# Patient Record
Sex: Female | Born: 1960 | Race: White | Hispanic: No | Marital: Married | Smoking: Former smoker
Health system: Southern US, Community
[De-identification: ages and names within clinical notes are randomized; demographics above are authoritative.]

## PROBLEM LIST (undated history)

## (undated) DIAGNOSIS — J45909 Unspecified asthma, uncomplicated: Secondary | ICD-10-CM

## (undated) DIAGNOSIS — E119 Type 2 diabetes mellitus without complications: Secondary | ICD-10-CM

## (undated) DIAGNOSIS — M545 Low back pain, unspecified: Secondary | ICD-10-CM

## (undated) DIAGNOSIS — J42 Unspecified chronic bronchitis: Secondary | ICD-10-CM

## (undated) DIAGNOSIS — K219 Gastro-esophageal reflux disease without esophagitis: Secondary | ICD-10-CM

## (undated) DIAGNOSIS — I219 Acute myocardial infarction, unspecified: Secondary | ICD-10-CM

## (undated) DIAGNOSIS — M1990 Unspecified osteoarthritis, unspecified site: Secondary | ICD-10-CM

## (undated) DIAGNOSIS — F329 Major depressive disorder, single episode, unspecified: Secondary | ICD-10-CM

## (undated) DIAGNOSIS — F32A Depression, unspecified: Secondary | ICD-10-CM

## (undated) DIAGNOSIS — F319 Bipolar disorder, unspecified: Secondary | ICD-10-CM

## (undated) DIAGNOSIS — G629 Polyneuropathy, unspecified: Secondary | ICD-10-CM

## (undated) DIAGNOSIS — K279 Peptic ulcer, site unspecified, unspecified as acute or chronic, without hemorrhage or perforation: Secondary | ICD-10-CM

## (undated) DIAGNOSIS — I10 Essential (primary) hypertension: Secondary | ICD-10-CM

## (undated) DIAGNOSIS — E785 Hyperlipidemia, unspecified: Secondary | ICD-10-CM

## (undated) DIAGNOSIS — I639 Cerebral infarction, unspecified: Secondary | ICD-10-CM

## (undated) DIAGNOSIS — G8929 Other chronic pain: Secondary | ICD-10-CM

## (undated) DIAGNOSIS — R569 Unspecified convulsions: Secondary | ICD-10-CM

## (undated) DIAGNOSIS — G43909 Migraine, unspecified, not intractable, without status migrainosus: Secondary | ICD-10-CM

## (undated) DIAGNOSIS — F419 Anxiety disorder, unspecified: Secondary | ICD-10-CM

## (undated) DIAGNOSIS — A048 Other specified bacterial intestinal infections: Secondary | ICD-10-CM

## (undated) HISTORY — PX: CHOLECYSTECTOMY OPEN: SUR202

## (undated) HISTORY — PX: APPENDECTOMY: SHX54

## (undated) HISTORY — PX: TUBAL LIGATION: SHX77

---

## 1998-06-08 ENCOUNTER — Emergency Department (HOSPITAL_COMMUNITY): Admission: EM | Admit: 1998-06-08 | Discharge: 1998-06-08 | Payer: Self-pay | Admitting: Emergency Medicine

## 2001-07-31 ENCOUNTER — Emergency Department (HOSPITAL_COMMUNITY): Admission: EM | Admit: 2001-07-31 | Discharge: 2001-07-31 | Payer: Self-pay | Admitting: Emergency Medicine

## 2001-08-19 ENCOUNTER — Emergency Department (HOSPITAL_COMMUNITY): Admission: EM | Admit: 2001-08-19 | Discharge: 2001-08-19 | Payer: Self-pay | Admitting: Emergency Medicine

## 2001-08-19 ENCOUNTER — Encounter: Payer: Self-pay | Admitting: Emergency Medicine

## 2001-11-21 ENCOUNTER — Emergency Department (HOSPITAL_COMMUNITY): Admission: EM | Admit: 2001-11-21 | Discharge: 2001-11-22 | Payer: Self-pay | Admitting: Emergency Medicine

## 2001-11-22 ENCOUNTER — Encounter: Payer: Self-pay | Admitting: Family Medicine

## 2001-11-22 ENCOUNTER — Encounter: Payer: Self-pay | Admitting: Emergency Medicine

## 2002-02-01 ENCOUNTER — Emergency Department (HOSPITAL_COMMUNITY): Admission: EM | Admit: 2002-02-01 | Discharge: 2002-02-01 | Payer: Self-pay | Admitting: Emergency Medicine

## 2002-02-01 ENCOUNTER — Encounter: Payer: Self-pay | Admitting: Emergency Medicine

## 2002-02-13 ENCOUNTER — Emergency Department (HOSPITAL_COMMUNITY): Admission: EM | Admit: 2002-02-13 | Discharge: 2002-02-13 | Payer: Self-pay | Admitting: Emergency Medicine

## 2002-03-16 ENCOUNTER — Emergency Department (HOSPITAL_COMMUNITY): Admission: EM | Admit: 2002-03-16 | Discharge: 2002-03-16 | Payer: Self-pay | Admitting: Emergency Medicine

## 2002-03-16 ENCOUNTER — Encounter: Payer: Self-pay | Admitting: Emergency Medicine

## 2002-05-02 ENCOUNTER — Encounter: Payer: Self-pay | Admitting: Emergency Medicine

## 2002-05-02 ENCOUNTER — Emergency Department (HOSPITAL_COMMUNITY): Admission: EM | Admit: 2002-05-02 | Discharge: 2002-05-02 | Payer: Self-pay | Admitting: Emergency Medicine

## 2002-05-30 ENCOUNTER — Emergency Department (HOSPITAL_COMMUNITY): Admission: AD | Admit: 2002-05-30 | Discharge: 2002-05-31 | Payer: Self-pay

## 2002-05-31 ENCOUNTER — Encounter: Payer: Self-pay | Admitting: Emergency Medicine

## 2002-08-01 ENCOUNTER — Encounter: Payer: Self-pay | Admitting: Emergency Medicine

## 2002-08-01 ENCOUNTER — Emergency Department (HOSPITAL_COMMUNITY): Admission: EM | Admit: 2002-08-01 | Discharge: 2002-08-02 | Payer: Self-pay | Admitting: *Deleted

## 2003-02-11 ENCOUNTER — Emergency Department (HOSPITAL_COMMUNITY): Admission: EM | Admit: 2003-02-11 | Discharge: 2003-02-11 | Payer: Self-pay | Admitting: Emergency Medicine

## 2003-03-05 ENCOUNTER — Emergency Department (HOSPITAL_COMMUNITY): Admission: EM | Admit: 2003-03-05 | Discharge: 2003-03-05 | Payer: Self-pay | Admitting: Emergency Medicine

## 2003-03-09 ENCOUNTER — Ambulatory Visit (HOSPITAL_COMMUNITY): Admission: RE | Admit: 2003-03-09 | Discharge: 2003-03-09 | Payer: Self-pay | Admitting: Orthopedic Surgery

## 2003-07-04 ENCOUNTER — Emergency Department (HOSPITAL_COMMUNITY): Admission: EM | Admit: 2003-07-04 | Discharge: 2003-07-04 | Payer: Self-pay | Admitting: Emergency Medicine

## 2003-08-03 ENCOUNTER — Emergency Department (HOSPITAL_COMMUNITY): Admission: EM | Admit: 2003-08-03 | Discharge: 2003-08-03 | Payer: Self-pay | Admitting: Emergency Medicine

## 2005-07-07 HISTORY — PX: ESOPHAGOGASTRODUODENOSCOPY ENDOSCOPY: SHX5814

## 2005-07-07 HISTORY — PX: FLEXIBLE SIGMOIDOSCOPY: SHX1649

## 2005-07-09 ENCOUNTER — Emergency Department (HOSPITAL_COMMUNITY): Admission: EM | Admit: 2005-07-09 | Discharge: 2005-07-09 | Payer: Self-pay | Admitting: Emergency Medicine

## 2005-08-01 ENCOUNTER — Encounter: Payer: Self-pay | Admitting: Emergency Medicine

## 2005-08-02 ENCOUNTER — Inpatient Hospital Stay (HOSPITAL_COMMUNITY): Admission: EM | Admit: 2005-08-02 | Discharge: 2005-08-07 | Payer: Self-pay | Admitting: Internal Medicine

## 2005-08-03 ENCOUNTER — Encounter (INDEPENDENT_AMBULATORY_CARE_PROVIDER_SITE_OTHER): Payer: Self-pay | Admitting: Specialist

## 2005-10-20 ENCOUNTER — Emergency Department (HOSPITAL_COMMUNITY): Admission: EM | Admit: 2005-10-20 | Discharge: 2005-10-20 | Payer: Self-pay | Admitting: *Deleted

## 2006-05-17 ENCOUNTER — Emergency Department (HOSPITAL_COMMUNITY): Admission: EM | Admit: 2006-05-17 | Discharge: 2006-05-18 | Payer: Self-pay | Admitting: Emergency Medicine

## 2006-07-27 ENCOUNTER — Emergency Department (HOSPITAL_COMMUNITY): Admission: EM | Admit: 2006-07-27 | Discharge: 2006-07-27 | Payer: Self-pay | Admitting: Emergency Medicine

## 2006-08-01 ENCOUNTER — Emergency Department (HOSPITAL_COMMUNITY): Admission: EM | Admit: 2006-08-01 | Discharge: 2006-08-02 | Payer: Self-pay | Admitting: Emergency Medicine

## 2006-10-29 IMAGING — CT CT PELVIS W/ CM
1 of 2 series · 15 of 32 positions shown, 19 images · IV contrast (omnipaque)
Comparison: 03/05/03.

CLINICAL DATA: Epigastric pain/abdominal pain with nausea and vomiting/cerebral concussion.
 ABDOMEN CT WITH CONTRAST:
TECHNIQUE: Multidetector CT imaging of the abdomen was performed following the standard protocol during bolus administration of intravenous contrast.
 Contrast:  120 cc Omnipaque 300 and oral contrast.
TECHNIQUE: Multidetector CT imaging of the pelvis was performed following the standard protocol during bolus administration of intravenous contrast.

[Series 2: abd_pel 5.0 b40f st · axial · 0.84mm/px · z∈[-428,+18]mm · 15 of 99 slices shown, 19 images]
[im 5/99  soft-tissue]
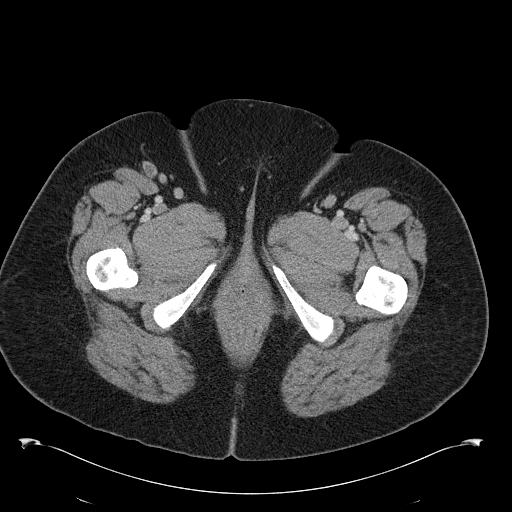
[im 5/99  bone]
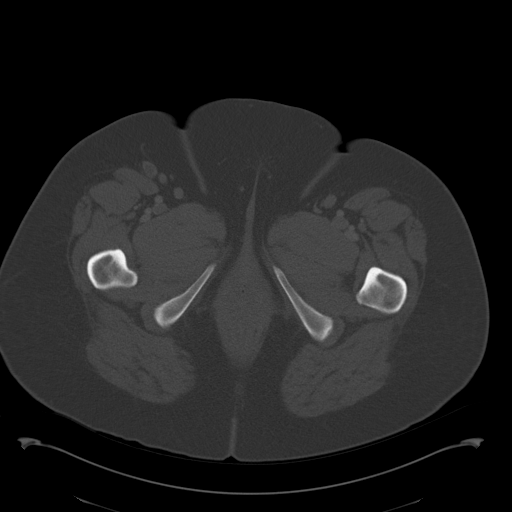
[im 14/99  soft-tissue]
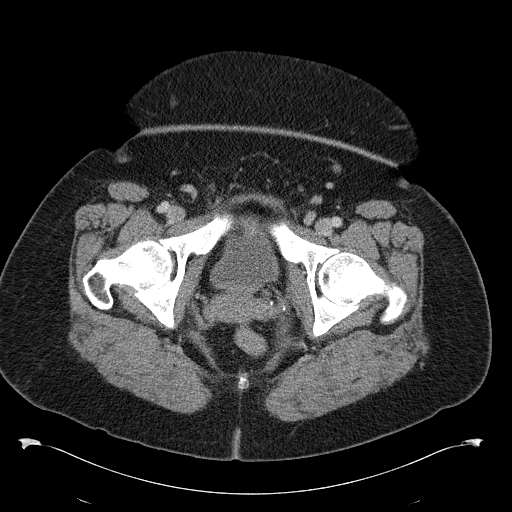
[im 23/99  soft-tissue]
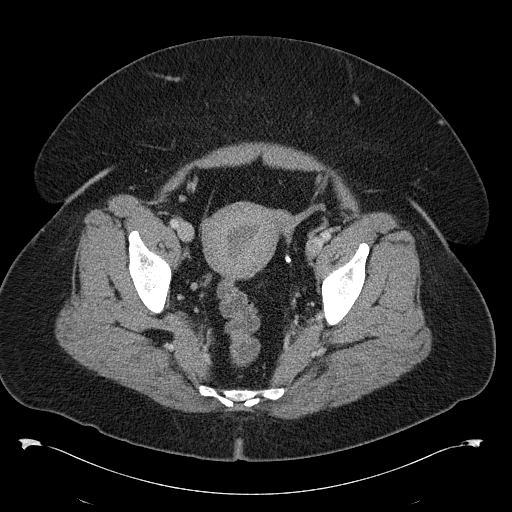
[im 27/99  soft-tissue]
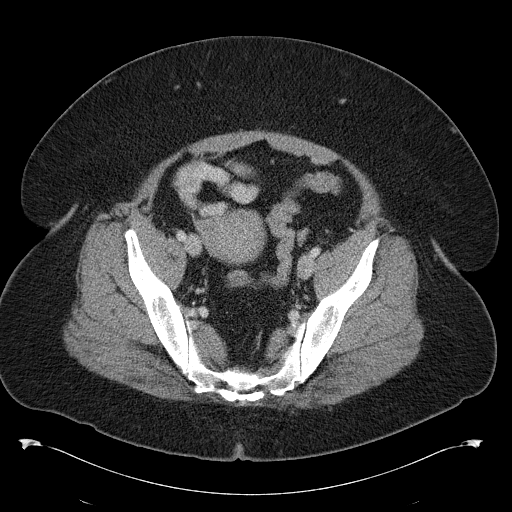
[im 36/99  soft-tissue]
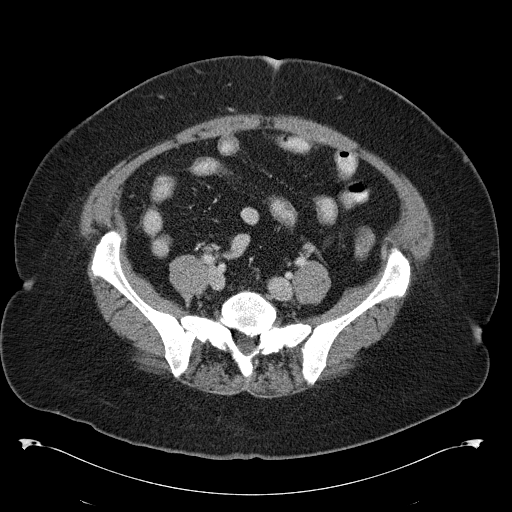
[im 41/99  soft-tissue]
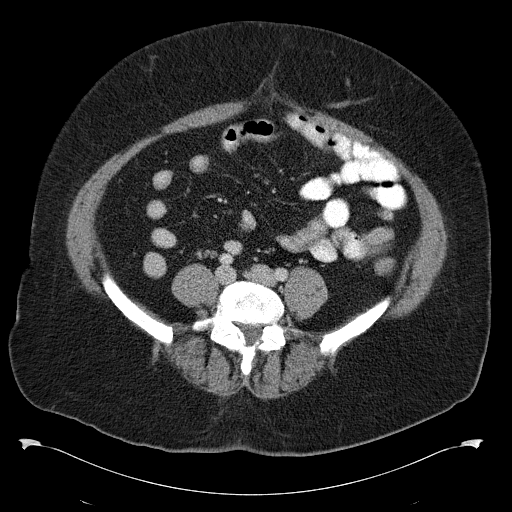
[im 49/99  soft-tissue]
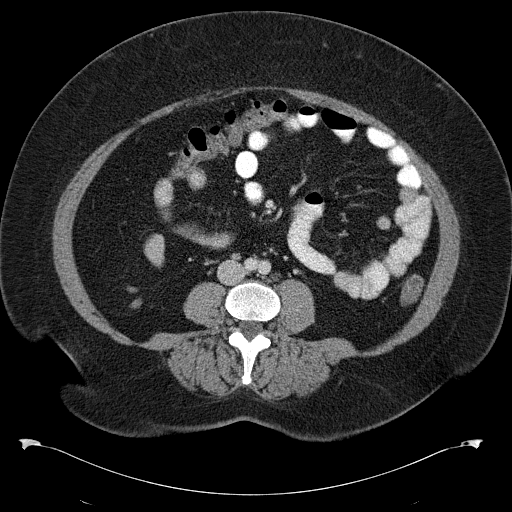
[im 58/99  soft-tissue]
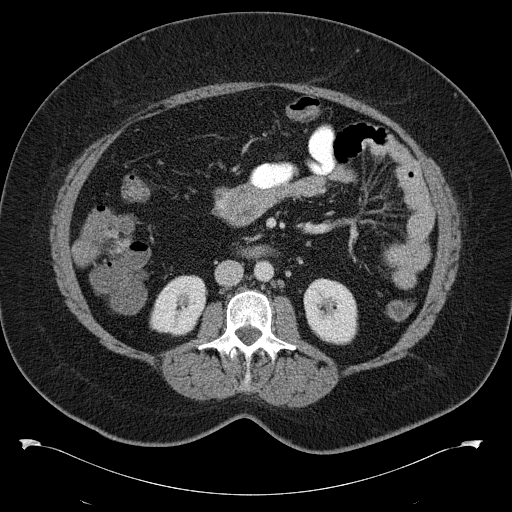
[im 63/99  soft-tissue]
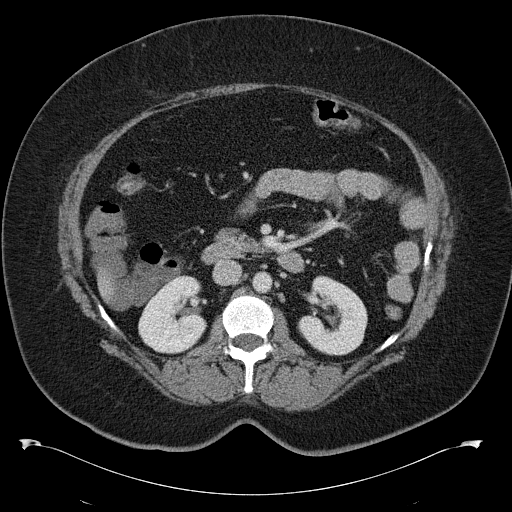
[im 63/99  bone]
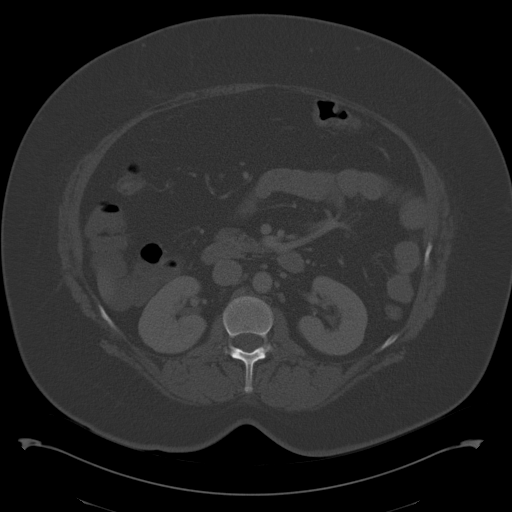
[im 72/99  soft-tissue]
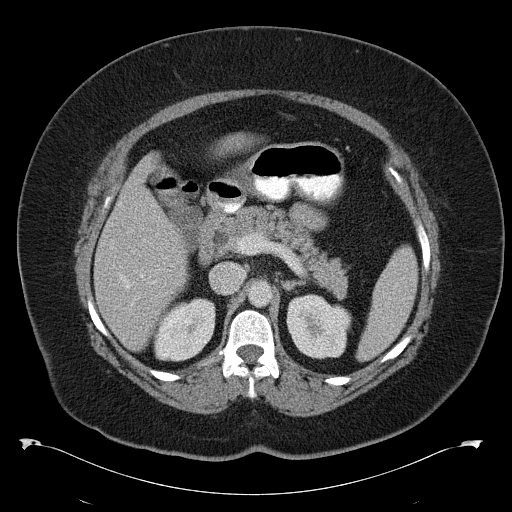
[im 76/99  soft-tissue]
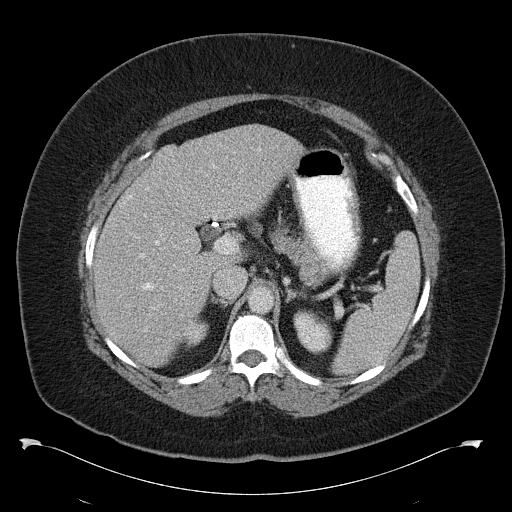
[im 81/99  lung]
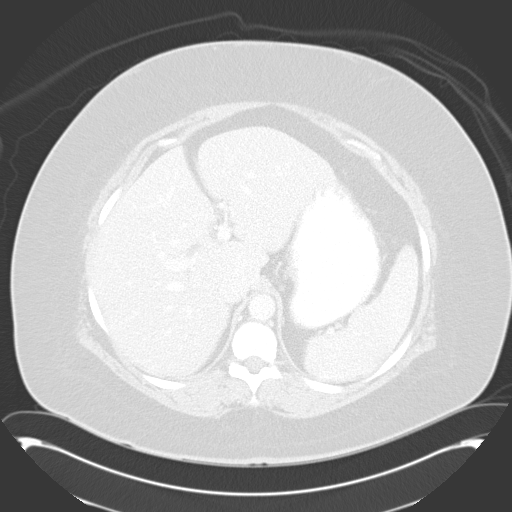
[im 85/99  soft-tissue]
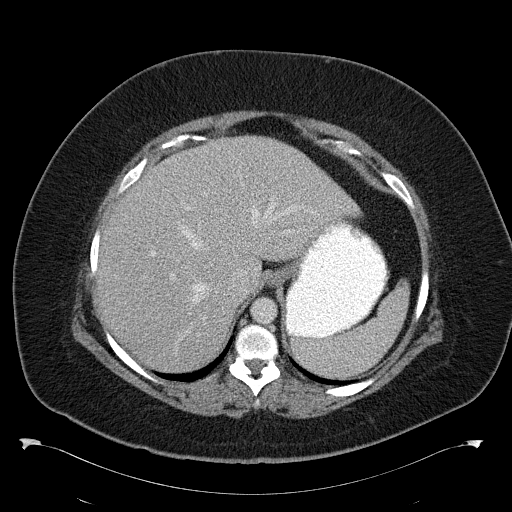
[im 85/99  lung]
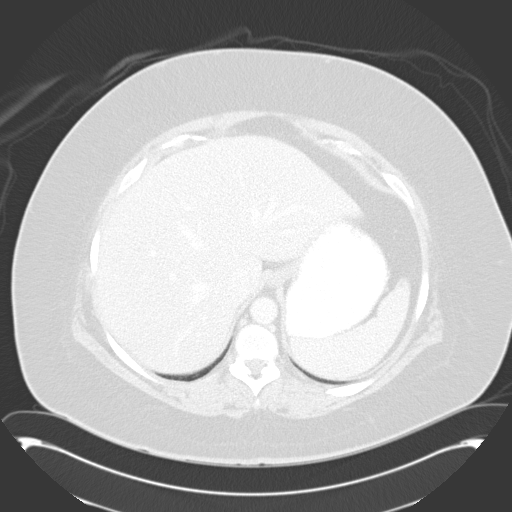
[im 90/99  lung]
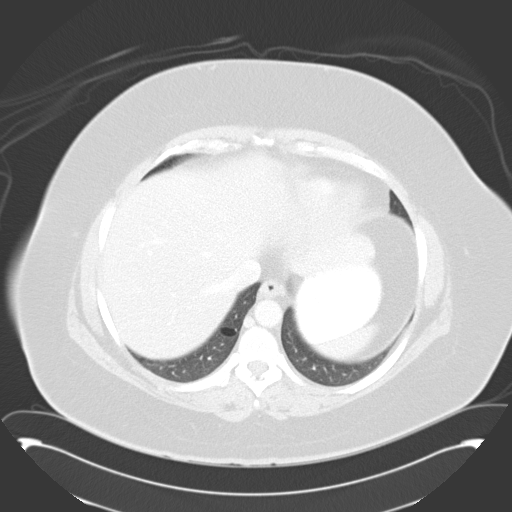
[im 94/99  soft-tissue]
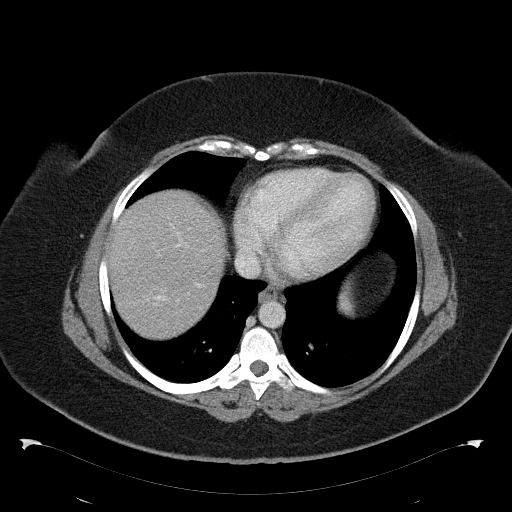
[im 94/99  lung]
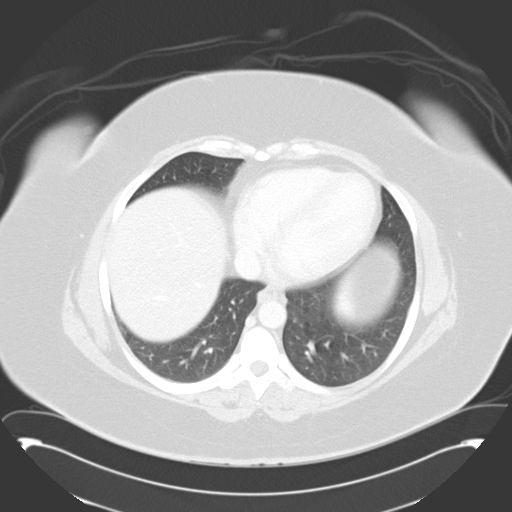

[15 of 32 positions shown; findings below may reference images not displayed]

FINDINGS: Highest cuts include the lung bases, which are clear.  
 Liver, spleen, pancreas, and adrenals are normal.  Priory cholecystectomy.  No adenopathy or ascites.  Kidneys are normal.  No ureteral dilatation.  Visualized bowel is normal.  No free air.
IMPRESSION: No acute or significant findings ? prior cholecystectomy.
 PELVIS CT WITH CONTRAST:
FINDINGS: No focal masses, adenopathy, or fluid collections.  The patient has had prior tubal ligation.  No free fluid.  
 Bony pelvis is intact.
IMPRESSION: No acute or significant findings ? prior tubal ligation.

## 2010-06-24 NOTE — Discharge Summary (Signed)
NAMEJerolyn Shin                ACCOUNT NO.:  [id number]   MEDICAL RECORD NO.:  [id number]          PATIENT TYPE:  INP   LOCATION:  5709                         FACILITY:  [facility]   PHYSICIAN:  Mobolaji B. Bakare, M.D.DATE OF BIRTH:  1960-09-09   DATE OF ADMISSION:  08/02/2005  DATE OF DISCHARGE:                                 DISCHARGE SUMMARY   PRIMARY CARE PHYSICIAN:  The patient is unassigned.   This is an interim dictation.   PROCEDURES:  1.  Abdominal x-ray, done on 26th of June, showed no acute disease.  2.  CT scan abdomen, done on 26th of June, showed no acute significant      findings.  There is prior cholecystectomy.  3.  CT scan of the pelvis:  No acute significant findings.  Prior tubal      ligation.  4.  Small bowel follow-through, done on the 29th of June:  Normal small-      bowel study.  Extensive retained food material in the stomach, however,      there was no gastric outlet obstruction.  Radiologist suggests just      recently ingested food material.  5.  Flexible sigmoidoscopy showed no abnormality.  Biopsies were sent to      rule out microscopic colitis.  Biopsy reports came back showing no      abnormality.  Benign colonic mucosa.  No active inflammation.  No      microscopic colitis or granulomas.  6.  EGD, done on August 03, 2005, showed erythematous and nodular antrum,      likely secondary to past peptic ulcer disease.  Otherwise, normal EGD.      Biopsy taken showed chronic active gastritis with focal erosion and      reactive changes.  There was no Helicobacter pylori, dysplasia, or      carcinoma identified.   BRIEF HISTORY:  Tonya Sanders is a 50 year old Caucasian female who presented  with acute onset of abdominal pain, nausea, vomiting.  She also had a high  fever prior to admission.  On admission, her temperature was 99.1.  The  patient also gave a history of chronic diarrhea for over one month,  particularly after eating and is not related to  any particular food, but  this had become exacerbated in the last 24 hours prior to admission.   She had a CT scan in the emergency room which was unremarkable.  Physical  examination was significant for tenderness in the left upper quadrant and  epigastrium.   FINAL DIAGNOSES:  1.  Chronic gastritis.  2.  Acute on chronic diarrhea.  3.  Abdominal pain, likely gastroenteritis.  4.  Obesity.  5.  Hyperglycemia.  6.  Hypokalemia, repleted.   HOSPITAL COURSE:  Abdominal pain, nausea and vomiting:  The patient was  admitted and started on conservative treatment for possible acute  gastroenteritis.  She was kept n.p.o., given IV fluid, and Dilaudid for  pain.  Tenderness was mainly in the epigastrium and left upper quadrant, and  these persisted after six hours of admission.  Gastroenterology  consult was  obtained given the patient's history of peptic ulcer disease.  She had an  upper endoscopy which revealed erythematous mucosal fold.  Biopsy reports  were consistent with chronic active gastritis.   The patient was resumed on clear liquid diet, and diet was advanced.  She  was unable to tolerate a full diet.  She had an episode of vomiting, and  concern of possible gastroparesis was raised, given the small bowel follow-  through report which had noted retained food.  However, on EGD, there was no  evidence to support retained food, and as per radiologic report, the  retained food appeared to be recently ingested food.  The plan is to do a  gastric emptying test if vomiting persists.   Acute on chronic diarrhea:  The patient had a flexible sigmoidoscopy which  was unremarkable.  Biopsy reports were negative.  Results of Giardia and  cryptococcal antigen are pending at this time.   Hyperglycemia/obesity:  The patient was noted to have fasting blood glucose  in the range of 115-116.  Hemoglobin A1c was slightly elevated.  She was  counseled on lifestyle modification including exercise  and diabetic  education.  I did not feel she would require medication at this time.  She  should have a repeat fasting blood sugar done in three months.   PERTINENT LABORATORY DATA:  Urine drug screen was unremarkable.  Tracheal  leukocytes negative.  Hemoglobin A1c was 6.6.  C. diff toxin negative.  Blood culture negative.  Stool culture negative.  Sodium 139, potassium 4.3,  chloride 105, bicarb 27, glucose 113, BUN 4, creatinine 0.8, calcium 9.1.  White cells 8.4, hemoglobin 12.2, hematocrit 34.9, platelets 369.   DISCHARGE MEDICATIONS:  Protonix 40 mg daily.   DISCHARGE FOLLOW-UP:  The patient will need to follow up with Dr. Bosie Clos  in four weeks.      Mobolaji B. Corky Downs, M.D.  Electronically Signed     MBB/MEDQ  D:  08/06/2005  T:  08/06/2005  Job:  [id number]   cc:   Shirley Friar, MD  Fax: [phone]

## 2010-06-24 NOTE — Op Note (Signed)
NAMEJerolyn Shin                ACCOUNT NO.:  [id number]   MEDICAL RECORD NO.:  [id number]          PATIENT TYPE:  INP   LOCATION:  4733                         FACILITY:  [facility]   PHYSICIAN:  Shirley Friar, MDDATE OF BIRTH:  01-09-1961   DATE OF PROCEDURE:  08/03/2005  DATE OF DISCHARGE:                                 OPERATIVE REPORT   INDICATIONS:  Abdominal pain, history of peptic ulcer disease.   MEDICATIONS:  Fentanyl 75 mcg IV, Versed 6 mg IV.   FINDINGS:  Endoscope was inserted through the oropharynx, esophagus was  intubated which was normal in its entirety.  The endoscope was advanced into  the stomach and in the antrum there was a linear area of discrete nodules  with surrounding edema and one nodule with a small shallow serpiginous  ulceration.  At the one side of the pyloric channel was a moderately  prominent area mucosa with edema and erythema.  The endoscope was easily  advanced through the pyloric channel into the duodenal bulb and second  portion of duodenum which were both normal.  Endoscope was withdrawn back  into the stomach and retroflexion revealed normal cardia, fundus and  angularis.  The endoscope was straightened and random biopsies were taken of  the edematous folds.  Endoscope was withdrawn confirmed above findings.   ASSESSMENT:  1.  Edematous and nodular, antrum, likely secondary to past peptic ulcer      disease - status post biopsy.  2.  Otherwise normal EGD.   PLAN:  1.  Follow-up on path.  2.  Proton pump inhibitor p.o. twice a day.  3.  Since CT scan negative for any acute changes will recommend small-bowel      follow-through to further evaluate this abdominal pain.      Shirley Friar, MD  Electronically Signed     VCS/MEDQ  D:  08/03/2005  T:  08/03/2005  Job:  [id number]

## 2010-06-24 NOTE — H&P (Signed)
NAMEJerolyn Sanders                ACCOUNT NO.:  [id number]   MEDICAL RECORD NO.:  [id number]          PATIENT TYPE:  INP   LOCATION:  0103                         FACILITY:  [facility]   PHYSICIAN:  Hillery Aldo, M.D.   DATE OF BIRTH:  Dec 08, 1960   DATE OF ADMISSION:  08/01/2005  DATE OF DISCHARGE:                                HISTORY & PHYSICAL   PRIMARY CARE PHYSICIAN:  The patient is unassigned.   CHIEF COMPLAINT:  Abdominal pain, nausea, and vomiting.   HISTORY OF PRESENT ILLNESS:  The patient is a 50 year old female with a past  medical history of peptic ulcer disease who presents with the sudden onset  of abdominal pain in the midepigastric area accompanied by nausea and  vomiting that started abruptly at 11:00 a.m. today.  She denies any  hematemesis.  She also states that her symptoms have been associated with  several bouts of loose stools.  Denies any melena or medication  hematochezia.  Denies any sick contacts.  Denies that her history of peptic  ulcer disease was similar in terms of symptoms.  No fever or chills.  She is  being admitted for further evaluation and workup.   PAST MEDICAL HISTORY:  1.  Asthma.  2.  Bronchitis.  3.  Peptic ulcer disease.  4.  Degenerative disk disease.  5.  Obesity.  6.  Status post appendectomy.  7.  Status post cholecystectomy.   FAMILY HISTORY:  The patient's mother is alive at 47 and has hypertension,  diabetes, coronary disease and asthma.  Father's medical history is unknown.  She has 1 sister who suffers with hypertension, diabetes, and heart disease.   SOCIAL HISTORY:  The patient is separated and lives with her new fiance.  She quit smoking 25 years ago.  Denies any alcohol or drug use.  She works  as a Surveyor, [employer].  She has 3 offspring who are healthy.   ALLERGIES:  AMPICILLIN and ASPIRIN cause rash.   MEDICATIONS:  None.   REVIEW OF SYSTEMS:  As per HPI, otherwise negative.   PHYSICAL EXAM:  VITAL SIGNS:   Temperature 98.5, pulse 106, respirations 16,  blood pressure 135/90, O2 saturation 97% on room air.  GENERAL:  An obese female in no acute distress.  HEENT:  Normocephalic and atraumatic.  PERRL.  EOMI.  Oropharynx reveals dry  mucous membranes.  NECK:  Supple, no thyromegaly, no lymphadenopathy, no jugular venous  distension.  CHEST:  Lungs clear to auscultation bilaterally with good air movement.  HEART:  Tachycardiac rate, regular rhythm, no murmurs, rubs, gallops.  ABDOMEN:  The patient does have some tenderness about the epigastric area.  She has decreased bowel sounds.  Otherwise her abdomen is soft and without  masses.  RECTAL EXAM:  Normal tone.  Liquid brown stool in the vault, heme negative.  EXTREMITIES:  No clubbing, edema, or cyanosis.  SKIN:  Warm and dry.  No rashes.  NEUROLOGIC:  The patient is somewhat sedated having been given pain and  antiemetic medications.  She answers questions appropriately when awakened,  but falls quickly back  to sleep.  Nonfocal exam.   DATA REVIEW   CHEST X-RAY:  Shows no acute disease.   CT OF THE ABDOMEN AND PELVIS:  Shows no acute findings and is stable when  compared with her prior CT scans.   LABORATORY DATA:  Urine pregnancy test is negative.  Sodium 140, potassium  4, chloride 103, bicarb 29, BUN 7, creatinine 0.8, glucose 120, calcium 9.3,  total protein 6.6, albumin 3.3, AST 23, ALT 20, alkaline phosphatase 95,  total bilirubin 0.6.  Lipase 23.  White blood cell count is 12.9, hemoglobin  12.6, hematocrit 36.6, platelet count 453 with an absolute neutrophil count  of 7.5 and an MCV of 90.  Urinalysis reveals a specific gravity of 1.030 and  is negative for nitrites and leukocyte esterase.   ASSESSMENT/PLAN:  1.  Abdominal pain.  Unclear etiology.  The patient may have a virally      induced gastroenteritis.  She may have a recent exacerbation of peptic      ulcer disease.  We will treat her conservatively for now by  admitting      her to the hospital, keeping her n.p.o., providing IV fluid rehydration,      placing her on twice daily proton pump inhibitor therapy, and monitoring      her clinical course.  We will use pain and antiemetic medications p.r.n.      for comfort.  Her abdominal exam is benign.  However, if this changes.      We would obtain a surgical consultation.  Her labs are relatively      normal.  No further diagnostic testing is indicated at this time.  If      her symptoms do not improve, consideration can be made for obtaining a      GI consultation for an upper endoscopy.  2.  Asthma.  The patient's asthma is currently quiescent.  If she develops      any problems with bronchospasm.  She can have albuterol on an as-needed      basis.  3.  Dehydration.  The patient is mildly dehydrated based on clinical exam      and her urine specific gravity.  She does not have an elevated BUN to      creatinine ratio so this is a relatively mild dehydration.  We will      administer IV fluids as outlined above.  4.  Prophylaxis:  Initiate GI and DVT prophylaxis.      Hillery Aldo, M.D.  Electronically Signed     CR/MEDQ  D:  08/01/2005  T:  08/01/2005  Job:  [id number]

## 2010-06-24 NOTE — Op Note (Signed)
NAMEJerolyn Sanders                ACCOUNT NO.:  [id number]   MEDICAL RECORD NO.:  [id number]          PATIENT TYPE:  INP   LOCATION:  4733                         FACILITY:  [facility]   PHYSICIAN:  Shirley Friar, MDDATE OF BIRTH:  17-Nov-1960   DATE OF PROCEDURE:  08/03/2005  DATE OF DISCHARGE:                                 OPERATIVE REPORT   INDICATION:  Chronic diarrhea.   MEDICATIONS:  Fentanyl 25 mcg IV, Versed 2 mg IV, additional medicines given  for preceding EGD.   FINDINGS:  Rectal exam was normal.  An adult adjustable colonoscope was  inserted into unprepped colon and advanced to 60 cm without difficulty.  On  careful withdrawal, the colonoscope revealed normal-appearing mucosa and  random biopsies were taken of this mucosa to rule out microscopic colitis.  Retroflexion was unremarkable.   ASSESSMENT:  1.  Normal flexible sigmoidoscopy - status post random biopsy to rule out      microscopic colitis.   PLAN:  1.  Follow-up on biopsy.  2.  Check stool for Giardia Lamblia antigen.      Shirley Friar, MD  Electronically Signed     VCS/MEDQ  D:  08/03/2005  T:  08/03/2005  Job:  [id number] [id number]

## 2010-06-24 NOTE — Consult Note (Signed)
NAMEJerolyn Sanders                ACCOUNT NO.:  [id number]   MEDICAL RECORD NO.:  [id number]            PATIENT TYPE:   LOCATION:                                 FACILITY:   PHYSICIAN:  Shirley Friar, MD     DATE OF BIRTH:   DATE OF CONSULTATION:  08/03/2005  DATE OF DISCHARGE:                                   CONSULTATION   REQUESTING PHYSICIAN:  Dr. Corky Downs.   REASON FOR CONSULTATION:  Abdominal pain, diarrhea.   HISTORY OF PRESENT ILLNESS:  [name] is a 50 year old white female who  presents with a 4-day history of severe epigastric abdominal pain with  associated vomiting.  Abdominal pain occurred all of a sudden without any  warning signs.  She denies any history of hematemesis or black colored  stools.  She also denies any heartburn, indigestion, dysphagia or weight  loss.  She also has been having loose stools for the past several months  that occurs every time she eats, but sometimes she will have loose stools 7  to 9 times a day without any blood.  She had stool studies done which have  been negative including negative stool culture, negative C. difficile toxin  and no fecal white blood cells.   PAST MEDICAL HISTORY:  1.  Morbid obesity.  2.  Peptic ulcer history of peptic ulcer disease.  3.  Asthma.  4.  Bronchitis.  5.  Degenerative joint disease.  6.  Status post appendectomy.  7.  Status post cholecystectomy.   MEDICATIONS ON ADMISSION:  None.   ALLERGIES:  Ampicillin and aspirin caused a rash.   REVIEW OF SYSTEMS:  Negative except as stated above.   PHYSICAL EXAMINATION:  VITAL SIGNS:  Temperature 99.1, pulse 80, blood  pressure 119/80, O2 sat is 94% on room air.  GENERAL:  Alert, no acute stress  HEENT:  Nonicteric sclerae.  CHEST:  Clear to auscultation anteriorly.  CARDIOVASCULAR:  Regular rhythm without murmurs.  ABDOMEN:  Severe epigastric tenderness otherwise nontender, soft,  nondistended, positive bowel sounds, guarding in the epigastric  region, no  rebound.  EXTREMITIES:  No edema.   LABORATORIES:  Hemoglobin 11.9, white blood count 9.5, platelet count 382,  potassium 3.2, BUN and creatinine 2 and 0.8.  Lipase 23.   IMPRESSION:  [age]-year-old white female with personal history of peptic  ulcer disease presenting with acute onset of epigastric abdominal pain and  vomiting concerning for peptic ulcer disease versus gastritis versus  gastroparesis.  The patient is also having chronic watery diarrhea without  any fecal white blood cells and no recent antibiotic exposure.  Question  microscopic colitis versus functional source such as irritable bowel  syndrome versus C. difficile colitis versus Giardia or other pathogens.   PLAN:  I discussed risks, benefits and alternatives with patient regarding  upper endoscopy and she agrees to proceed.  In addition to upper endoscopy,  I think she needs to have  unprepped flexible sigmoidoscopy with biopsy due  to her chronic diarrhea.  We will plan to do both of these procedures today.  I agree with Protonix 40 mg twice a day until after her upper endoscopy, and  if her upper endoscopy is negative can change to once a day Protonix.      Shirley Friar, MD  Electronically Signed     VCS/MEDQ  D:  08/03/2005  T:  08/03/2005  Job:  [id number]

## 2014-02-06 DIAGNOSIS — I219 Acute myocardial infarction, unspecified: Secondary | ICD-10-CM

## 2014-02-06 HISTORY — PX: CARDIAC CATHETERIZATION: SHX172

## 2014-02-06 HISTORY — DX: Acute myocardial infarction, unspecified: I21.9

## 2016-07-19 ENCOUNTER — Encounter (HOSPITAL_COMMUNITY): Payer: Self-pay | Admitting: *Deleted

## 2016-07-19 ENCOUNTER — Inpatient Hospital Stay (HOSPITAL_COMMUNITY)
Admission: EM | Admit: 2016-07-19 | Discharge: 2016-07-26 | DRG: 041 | Disposition: A | Discharge status: Home / Self Care | Payer: Medicare Other | Attending: Oncology | Admitting: Oncology

## 2016-07-19 ENCOUNTER — Emergency Department (HOSPITAL_COMMUNITY): Payer: Medicare Other

## 2016-07-19 DIAGNOSIS — Z7982 Long term (current) use of aspirin: Secondary | ICD-10-CM | POA: Diagnosis not present

## 2016-07-19 DIAGNOSIS — Z91018 Allergy to other foods: Secondary | ICD-10-CM

## 2016-07-19 DIAGNOSIS — I10 Essential (primary) hypertension: Secondary | ICD-10-CM | POA: Diagnosis not present

## 2016-07-19 DIAGNOSIS — F319 Bipolar disorder, unspecified: Secondary | ICD-10-CM | POA: Diagnosis present

## 2016-07-19 DIAGNOSIS — I63441 Cerebral infarction due to embolism of right cerebellar artery: Secondary | ICD-10-CM | POA: Diagnosis not present

## 2016-07-19 DIAGNOSIS — H532 Diplopia: Secondary | ICD-10-CM | POA: Diagnosis present

## 2016-07-19 DIAGNOSIS — R42 Dizziness and giddiness: Secondary | ICD-10-CM | POA: Diagnosis present

## 2016-07-19 DIAGNOSIS — Z91128 Patient's intentional underdosing of medication regimen for other reason: Secondary | ICD-10-CM | POA: Diagnosis not present

## 2016-07-19 DIAGNOSIS — E1169 Type 2 diabetes mellitus with other specified complication: Secondary | ICD-10-CM

## 2016-07-19 DIAGNOSIS — Q211 Atrial septal defect: Secondary | ICD-10-CM

## 2016-07-19 DIAGNOSIS — F419 Anxiety disorder, unspecified: Secondary | ICD-10-CM | POA: Diagnosis present

## 2016-07-19 DIAGNOSIS — Z881 Allergy status to other antibiotic agents status: Secondary | ICD-10-CM | POA: Diagnosis not present

## 2016-07-19 DIAGNOSIS — E1140 Type 2 diabetes mellitus with diabetic neuropathy, unspecified: Secondary | ICD-10-CM | POA: Diagnosis present

## 2016-07-19 DIAGNOSIS — Z6838 Body mass index (BMI) 38.0-38.9, adult: Secondary | ICD-10-CM | POA: Diagnosis not present

## 2016-07-19 DIAGNOSIS — Z794 Long term (current) use of insulin: Secondary | ICD-10-CM | POA: Diagnosis not present

## 2016-07-19 DIAGNOSIS — M7989 Other specified soft tissue disorders: Secondary | ICD-10-CM | POA: Diagnosis not present

## 2016-07-19 DIAGNOSIS — I252 Old myocardial infarction: Secondary | ICD-10-CM

## 2016-07-19 DIAGNOSIS — E119 Type 2 diabetes mellitus without complications: Secondary | ICD-10-CM

## 2016-07-19 DIAGNOSIS — E1159 Type 2 diabetes mellitus with other circulatory complications: Secondary | ICD-10-CM | POA: Diagnosis not present

## 2016-07-19 DIAGNOSIS — E669 Obesity, unspecified: Secondary | ICD-10-CM | POA: Diagnosis present

## 2016-07-19 DIAGNOSIS — Z7902 Long term (current) use of antithrombotics/antiplatelets: Secondary | ICD-10-CM

## 2016-07-19 DIAGNOSIS — E1165 Type 2 diabetes mellitus with hyperglycemia: Secondary | ICD-10-CM | POA: Diagnosis present

## 2016-07-19 DIAGNOSIS — K219 Gastro-esophageal reflux disease without esophagitis: Secondary | ICD-10-CM | POA: Diagnosis present

## 2016-07-19 DIAGNOSIS — R112 Nausea with vomiting, unspecified: Secondary | ICD-10-CM

## 2016-07-19 DIAGNOSIS — R569 Unspecified convulsions: Secondary | ICD-10-CM | POA: Diagnosis present

## 2016-07-19 DIAGNOSIS — F17210 Nicotine dependence, cigarettes, uncomplicated: Secondary | ICD-10-CM | POA: Diagnosis present

## 2016-07-19 DIAGNOSIS — E7800 Pure hypercholesterolemia, unspecified: Secondary | ICD-10-CM | POA: Diagnosis present

## 2016-07-19 DIAGNOSIS — I638 Other cerebral infarction: Secondary | ICD-10-CM | POA: Diagnosis not present

## 2016-07-19 DIAGNOSIS — Z880 Allergy status to penicillin: Secondary | ICD-10-CM

## 2016-07-19 DIAGNOSIS — I253 Aneurysm of heart: Secondary | ICD-10-CM | POA: Diagnosis present

## 2016-07-19 DIAGNOSIS — Z8673 Personal history of transient ischemic attack (TIA), and cerebral infarction without residual deficits: Secondary | ICD-10-CM | POA: Diagnosis not present

## 2016-07-19 DIAGNOSIS — E785 Hyperlipidemia, unspecified: Secondary | ICD-10-CM | POA: Diagnosis present

## 2016-07-19 DIAGNOSIS — I639 Cerebral infarction, unspecified: Principal | ICD-10-CM | POA: Diagnosis present

## 2016-07-19 DIAGNOSIS — T45526A Underdosing of antithrombotic drugs, initial encounter: Secondary | ICD-10-CM | POA: Diagnosis present

## 2016-07-19 DIAGNOSIS — I5030 Unspecified diastolic (congestive) heart failure: Secondary | ICD-10-CM | POA: Diagnosis not present

## 2016-07-19 HISTORY — DX: Major depressive disorder, single episode, unspecified: F32.9

## 2016-07-19 HISTORY — DX: Other specified bacterial intestinal infections: A04.8

## 2016-07-19 HISTORY — DX: Low back pain, unspecified: M54.50

## 2016-07-19 HISTORY — DX: Acute myocardial infarction, unspecified: I21.9

## 2016-07-19 HISTORY — DX: Cerebral infarction, unspecified: I63.9

## 2016-07-19 HISTORY — DX: Migraine, unspecified, not intractable, without status migrainosus: G43.909

## 2016-07-19 HISTORY — DX: Depression, unspecified: F32.A

## 2016-07-19 HISTORY — DX: Hyperlipidemia, unspecified: E78.5

## 2016-07-19 HISTORY — DX: Bipolar disorder, unspecified: F31.9

## 2016-07-19 HISTORY — DX: Unspecified osteoarthritis, unspecified site: M19.90

## 2016-07-19 HISTORY — DX: Gastro-esophageal reflux disease without esophagitis: K21.9

## 2016-07-19 HISTORY — DX: Peptic ulcer, site unspecified, unspecified as acute or chronic, without hemorrhage or perforation: K27.9

## 2016-07-19 HISTORY — DX: Essential (primary) hypertension: I10

## 2016-07-19 HISTORY — DX: Type 2 diabetes mellitus without complications: E11.9

## 2016-07-19 HISTORY — DX: Unspecified asthma, uncomplicated: J45.909

## 2016-07-19 HISTORY — DX: Unspecified chronic bronchitis: J42

## 2016-07-19 HISTORY — DX: Other chronic pain: G89.29

## 2016-07-19 HISTORY — DX: Low back pain: M54.5

## 2016-07-19 HISTORY — DX: Unspecified convulsions: R56.9

## 2016-07-19 HISTORY — DX: Anxiety disorder, unspecified: F41.9

## 2016-07-19 HISTORY — DX: Polyneuropathy, unspecified: G62.9

## 2016-07-19 LAB — I-STAT VENOUS BLOOD GAS, ED
Acid-Base Excess: 1 mmol/L (ref 0.0–2.0)
BICARBONATE: 26.9 mmol/L (ref 20.0–28.0)
O2 Saturation: 77 %
TCO2: 28 mmol/L (ref 0–100)
pCO2, Ven: 45.9 mmHg (ref 44.0–60.0)
pH, Ven: 7.376 (ref 7.250–7.430)
pO2, Ven: 43 mmHg (ref 32.0–45.0)

## 2016-07-19 LAB — CBC
HCT: 39.3 % (ref 36.0–46.0)
Hemoglobin: 13.5 g/dL (ref 12.0–15.0)
MCH: 32.5 pg (ref 26.0–34.0)
MCHC: 34.4 g/dL (ref 30.0–36.0)
MCV: 94.5 fL (ref 78.0–100.0)
Platelets: 279 10*3/uL (ref 150–400)
RBC: 4.16 MIL/uL (ref 3.87–5.11)
RDW: 12 % (ref 11.5–15.5)
WBC: 12.1 10*3/uL — ABNORMAL HIGH (ref 4.0–10.5)

## 2016-07-19 LAB — BASIC METABOLIC PANEL
ANION GAP: 10 (ref 5–15)
BUN: 10 mg/dL (ref 6–20)
CO2: 22 mmol/L (ref 22–32)
Calcium: 8.8 mg/dL — ABNORMAL LOW (ref 8.9–10.3)
Chloride: 104 mmol/L (ref 101–111)
Creatinine, Ser: 0.61 mg/dL (ref 0.44–1.00)
GFR calc Af Amer: >60 mL/min (ref >60)
GFR calc non Af Amer: >60 mL/min (ref >60)
Glucose, Bld: 240 mg/dL — ABNORMAL HIGH (ref 65–99)
Potassium: 4 mmol/L (ref 3.5–5.1)
Sodium: 136 mmol/L (ref 135–145)

## 2016-07-19 LAB — I-STAT TROPONIN, ED: Troponin i, poc: 0.02 ng/mL (ref 0.00–0.08)

## 2016-07-19 LAB — GLUCOSE, CAPILLARY: Glucose-Capillary: 223 mg/dL — ABNORMAL HIGH (ref 65–99)

## 2016-07-19 LAB — URINALYSIS, ROUTINE W REFLEX MICROSCOPIC
Bacteria, UA: NONE SEEN
Bilirubin Urine: NEGATIVE
Glucose, UA: >=500 mg/dL — AB
HGB URINE DIPSTICK: NEGATIVE
Ketones, ur: 20 mg/dL — AB
Leukocytes, UA: NEGATIVE
Nitrite: NEGATIVE
Protein, ur: NEGATIVE mg/dL
RBC / HPF: NONE SEEN RBC/hpf (ref 0–5)
Specific Gravity, Urine: 1.013 (ref 1.005–1.030)
pH: 7 (ref 5.0–8.0)

## 2016-07-19 LAB — CBG MONITORING, ED: Glucose-Capillary: 221 mg/dL — ABNORMAL HIGH (ref 65–99)

## 2016-07-19 MED ORDER — SENNOSIDES-DOCUSATE SODIUM 8.6-50 MG PO TABS
1.0000 | ORAL_TABLET | Freq: Every evening | ORAL | Status: DC | PRN
Start: 2016-07-19 — End: 2016-07-26

## 2016-07-19 MED ORDER — LISINOPRIL 5 MG PO TABS
5.0000 mg | ORAL_TABLET | Freq: Every day | ORAL | Status: DC
  Administered 2016-07-20 – 2016-07-25 (×6): 5 mg via ORAL
  Filled 2016-07-19 (×6): qty 1

## 2016-07-19 MED ORDER — ACETAMINOPHEN 160 MG/5ML PO SOLN: 650.0000 mg | ORAL | Status: DC | PRN

## 2016-07-19 MED ORDER — ATORVASTATIN CALCIUM 40 MG PO TABS
40.0000 mg | ORAL_TABLET | Freq: Every day | ORAL | Status: DC
  Administered 2016-07-20 – 2016-07-25 (×6): 40 mg via ORAL
  Filled 2016-07-19 (×6): qty 1

## 2016-07-19 MED ORDER — DIPHENHYDRAMINE HCL 50 MG/ML IJ SOLN
25.0000 mg | Freq: Once | INTRAMUSCULAR | Status: AC
  Administered 2016-07-19: 25 mg via INTRAVENOUS
  Filled 2016-07-19: qty 1

## 2016-07-19 MED ORDER — CLOPIDOGREL BISULFATE 75 MG PO TABS
75.0000 mg | ORAL_TABLET | Freq: Every day | ORAL | Status: DC
Start: 2016-07-19 — End: 2016-07-26
  Administered 2016-07-20 – 2016-07-25 (×6): 75 mg via ORAL
  Filled 2016-07-19 (×6): qty 1

## 2016-07-19 MED ORDER — INSULIN ASPART 100 UNIT/ML ~~LOC~~ SOLN
0.0000 [IU] | Freq: Three times a day (TID) | SUBCUTANEOUS | Status: DC
  Administered 2016-07-20: 8 [IU] via SUBCUTANEOUS
  Administered 2016-07-20: 5 [IU] via SUBCUTANEOUS
  Administered 2016-07-20 – 2016-07-21 (×2): 8 [IU] via SUBCUTANEOUS
  Administered 2016-07-21: 5 [IU] via SUBCUTANEOUS
  Administered 2016-07-21: 3 [IU] via SUBCUTANEOUS
  Administered 2016-07-22: 8 [IU] via SUBCUTANEOUS
  Administered 2016-07-22: 3 [IU] via SUBCUTANEOUS
  Administered 2016-07-22: 8 [IU] via SUBCUTANEOUS
  Administered 2016-07-23: 5 [IU] via SUBCUTANEOUS
  Administered 2016-07-23: 3 [IU] via SUBCUTANEOUS
  Administered 2016-07-23: 8 [IU] via SUBCUTANEOUS
  Administered 2016-07-24: 3 [IU] via SUBCUTANEOUS
  Administered 2016-07-24: 5 [IU] via SUBCUTANEOUS
  Administered 2016-07-24: 8 [IU] via SUBCUTANEOUS
  Administered 2016-07-25 – 2016-07-26 (×4): 5 [IU] via SUBCUTANEOUS

## 2016-07-19 MED ORDER — LORAZEPAM 2 MG/ML IJ SOLN
1.0000 mg | Freq: Once | INTRAMUSCULAR | Status: AC | PRN
  Administered 2016-07-19: 1 mg via INTRAVENOUS
  Filled 2016-07-19: qty 1

## 2016-07-19 MED ORDER — DIAZEPAM 5 MG PO TABS: 5.0000 mg | ORAL_TABLET | Freq: Two times a day (BID) | ORAL | Status: DC | PRN

## 2016-07-19 MED ORDER — SODIUM CHLORIDE 0.9 % IV BOLUS (SEPSIS)
1000.0000 mL | Freq: Once | INTRAVENOUS | Status: AC
  Administered 2016-07-19: 1000 mL via INTRAVENOUS

## 2016-07-19 MED ORDER — PROMETHAZINE HCL 25 MG/ML IJ SOLN
25.0000 mg | Freq: Once | INTRAMUSCULAR | Status: AC
  Administered 2016-07-19: 25 mg via INTRAVENOUS
  Filled 2016-07-19: qty 1

## 2016-07-19 MED ORDER — ASPIRIN 81 MG PO CHEW
81.0000 mg | CHEWABLE_TABLET | Freq: Every day | ORAL | Status: DC
  Administered 2016-07-20 – 2016-07-25 (×6): 81 mg via ORAL
  Filled 2016-07-19 (×6): qty 1

## 2016-07-19 MED ORDER — METOCLOPRAMIDE HCL 5 MG/ML IJ SOLN
10.0000 mg | Freq: Once | INTRAMUSCULAR | Status: AC
  Administered 2016-07-19: 10 mg via INTRAVENOUS
  Filled 2016-07-19: qty 2

## 2016-07-19 MED ORDER — ACETAMINOPHEN 650 MG RE SUPP: 650.0000 mg | RECTAL | Status: DC | PRN

## 2016-07-19 MED ORDER — ACETAMINOPHEN 325 MG PO TABS: 650.0000 mg | ORAL_TABLET | ORAL | Status: DC | PRN

## 2016-07-19 MED ORDER — DULOXETINE HCL 60 MG PO CPEP
60.0000 mg | ORAL_CAPSULE | Freq: Two times a day (BID) | ORAL | Status: DC
  Administered 2016-07-19 – 2016-07-25 (×13): 60 mg via ORAL
  Filled 2016-07-19 (×13): qty 1

## 2016-07-19 MED ORDER — STROKE: EARLY STAGES OF RECOVERY BOOK
Freq: Once | Status: AC
  Administered 2016-07-19: 22:00:00

## 2016-07-19 MED ORDER — ENSURE ENLIVE PO LIQD
237.0000 mL | Freq: Two times a day (BID) | ORAL | Status: DC
  Administered 2016-07-20: 237 mL via ORAL

## 2016-07-19 MED ORDER — ENOXAPARIN SODIUM 40 MG/0.4ML ~~LOC~~ SOLN
40.0000 mg | SUBCUTANEOUS | Status: DC
  Administered 2016-07-19 – 2016-07-25 (×7): 40 mg via SUBCUTANEOUS
  Filled 2016-07-19 (×6): qty 0.4

## 2016-07-19 NOTE — ED Notes (Signed)
Pt CBG was 221, notified Jessica(RN)

## 2016-07-19 NOTE — ED Notes (Signed)
Attempted report x 2 

## 2016-07-19 NOTE — ED Notes (Signed)
Attempted report x1. 

## 2016-07-19 NOTE — ED Notes (Signed)
Patient transported to X-ray 

## 2016-07-19 NOTE — ED Provider Notes (Signed)
[facility]ER[facility]ENCY DEPT Provider Note   CSN: [id number] Arrival date & time: 07/19/16  0312     History   Chief Complaint Chief Complaint  Patient presents with  . Hyperglycemia  . Emesis    HPI Tonya Sanders is a 56 y.o. female.  The history is provided by the patient and medical records.  Hyperglycemia  Associated symptoms: nausea and vomiting   Emesis      56 year old female with history of GERD, arthritis, hyperlipidemia, hypertension, neuropathy, seizures, history of stroke, presenting to the ED with hyperglycemia, nausea, and vomiting.  Patient states over the past few days her sugar has been running high. States she followed up with her primary care doctor yesterday and got refills of her medication, however pharmacy was closed by the time she got there so she did not pick them out.  States she has continued to have some N/V.  States any time she tries to eat or drink it comes back up.  She states she is starting to get a headache and has been feeling lightheaded because of this.  States this morning her sugar was 290 which is extremely high for her.  States her sugar rarely goes over 130 these days.  No changes in her dosage of medications, has been compliant until yesterday.  She has not had any recent illness.  No fever or chills.  States her chest is aching a bit, not sure if this is from vomiting or not.  Denies SOB.  No palpitations, diaphoresis.  No known cardiac hx.  Patient does take ASA and plavix.    Past Medical History:  Diagnosis Date  . Arthritis   . Diabetes mellitus without complication (HCC)   . GERD (gastroesophageal reflux disease)   . H. pylori infection   . Hyperlipidemia   . Hypertension   . Neuropathy   . Seizures (HCC)   . Stroke [facility])     There are no active problems to display for this patient.   No past surgical history on file.  OB History    No data available       Home Medications    Prior to Admission medications   Not on  File    Family History No family history on file.  Social History Social History  Substance Use Topics  . Smoking status: Former Games developer  . Smokeless tobacco: Never Used  . Alcohol use Yes     Allergies   Doxycycline; Penicillins; and Tomato   Review of Systems Review of Systems  Gastrointestinal: Positive for nausea and vomiting.  Endocrine:       Hyperglycemia  Neurological: Positive for light-headedness.  All other systems reviewed and are negative.    Physical Exam Updated Vital Signs BP (!) 204/121   Pulse 72   Resp 20   SpO2 99%   Physical Exam  Constitutional: She is oriented to person, place, and time. She appears well-developed and well-nourished. No distress.  Lying in bed, holding her head, no distress, has vomited into emesis bag  HENT:  Head: Normocephalic and atraumatic.  Right Ear: External ear normal.  Left Ear: External ear normal.  Mouth/Throat: Oropharynx is clear and moist.  Eyes: Conjunctivae and EOM are normal. Pupils are equal, round, and reactive to light.  Neck: Normal range of motion and full passive range of motion without pain. Neck supple. No neck rigidity.  No rigidity, no meningismus  Cardiovascular: Normal rate, regular rhythm and normal heart sounds.   No murmur  heard. Pulmonary/Chest: Effort normal and breath sounds normal. No respiratory distress. She has no wheezes. She has no rhonchi.  Abdominal: Soft. Bowel sounds are normal. There is no tenderness. There is no guarding.  Musculoskeletal: Normal range of motion. She exhibits no edema.  Neurological: She is alert and oriented to person, place, and time. She has normal strength. She displays no tremor. No cranial nerve deficit or sensory deficit. She displays no seizure activity.  AAOx3, answering questions and following commands appropriately; equal strength UE and LE bilaterally; CN grossly intact; moves all extremities appropriately without ataxia; no focal neuro deficits or  facial asymmetry appreciated  Skin: Skin is warm and dry. No rash noted. She is not diaphoretic.  Psychiatric: She has a normal mood and affect. Her behavior is normal. Thought content normal.  Nursing note and vitals reviewed.    ED Treatments / Results  Labs (all labs ordered are listed, but only abnormal results are displayed) Labs Reviewed  BASIC METABOLIC PANEL - Abnormal; Notable for the following:       Result Value   Glucose, Bld 240 (*)    Calcium 8.8 (*)    All other components within normal limits  CBC - Abnormal; Notable for the following:    WBC 12.1 (*)    All other components within normal limits  URINALYSIS, ROUTINE W REFLEX MICROSCOPIC - Abnormal; Notable for the following:    Color, Urine STRAW (*)    Glucose, UA >=500 (*)    Ketones, ur 20 (*)    Squamous Epithelial / LPF 0-5 (*)    All other components within normal limits  CBG MONITORING, ED - Abnormal; Notable for the following:    Glucose-Capillary 221 (*)    All other components within normal limits  I-STAT VENOUS BLOOD GAS, ED  I-STAT TROPOININ, ED    EKG  EKG Interpretation None       Radiology No results found.       Procedures Procedures (including critical care time)  Medications Ordered in ED Medications  sodium chloride 0.9 % bolus 1,000 mL (1,000 mLs Intravenous New Bag/Given 07/19/16 0903)  diphenhydrAMINE (BENADRYL) injection 25 mg (25 mg Intravenous Given 07/19/16 0903)  metoCLOPramide (REGLAN) injection 10 mg (10 mg Intravenous Given 07/19/16 0903)     Initial Impression / Assessment and Plan / ED Course  I have reviewed the triage vital signs and the nursing notes.  Pertinent labs & imaging results that were available during my care of the patient were reviewed by me and considered in my medical decision making (see chart for details).   56 y.o. F here with multiple complaints.  Notably, N/V/, headache, lightheadedness, and chest pain.  Afebrile, non-toxic.  Appears to  have been vomiting.  Lungs clear bilaterally.  Neurologically intact.  BP is elevated here.  CXR with low lung volumes, question of some mild interstitial thickening/edema.  No pneumonia or pulmonary edema.  CT head was obtained, however PACS not crossing over into EPIC-- findings are concerning for age indeterminate small right cerebellar infarct.  Recommended MRI for further evaluation.  MRI results again not crossing over into EPIC-- I've had formal report faxed over from radiology (see photo of findings above). MRI does show acute infarct in the lateral right cerebellum as well as a small remote right cerebellar infarct adjacent. There are some other older findings. Results discussed with patient. Neurology was notified, they will see patient in consult.  Patient will need admission.  Discussed with IM teaching  service-- will admit for ongoing care.  They are aware of issues with imaging in EPIC, have set aside hard copies of reports for their review.  Final Clinical Impressions(s) / ED Diagnoses   Final diagnoses:  Cerebellar stroke [facility])    New Prescriptions New Prescriptions   No medications on file     Garlon Hatchet, PA-C 07/19/16 1452

## 2016-07-19 NOTE — ED Notes (Signed)
Patient to MRI.

## 2016-07-19 NOTE — ED Notes (Signed)
Attempted PIV x2 without success

## 2016-07-19 NOTE — ED Triage Notes (Signed)
Pt c/o NV x 5 episodes tonight with high blood sugar. Pt vomiting during triage. Also c/o headache and dizziness. Was seen at [facility] earlier yesterday for same

## 2016-07-19 NOTE — ED Notes (Signed)
Pt taken in w/c to restroom to collect urine sample. Pt back in room, placed back on monitor.

## 2016-07-19 NOTE — H&P (Signed)
Date: 07/19/2016               Patient Name:  Tonya Sanders MRN: [id number]  DOB: November 29, 1960 Age / Sex: 56 y.o., female   PCP: Dr. Tora Perches              Medical Service: Internal Medicine Teaching Service              Attending Physician: Dr. Oswaldo Done, Marquita Palms, *    First Contact: Eppie Gibson, MS 3 Pager: [phone]  Second Contact: Dr. Ladona Ridgel Pager: [phone]  Third Contact Dr. Allena Katz Pager: [phone]       After Hours (After 5p/  First Contact Pager: [phone]  weekends / holidays): Second Contact Pager: [phone]   Chief Complaint: Headache  History of Present Illness: Tonya Sanders is a 56 yo woman with a history of stroke who presents with an acute headache and nausea.  She says that she woke up this morning at 4:45 with an intense headache located along the left lateral and posterior part of her head.  She also felt nauseous and vomited multiple times during this time.  She reports never having symptoms like this before.  She says that nothing alleviated the pain or nausea.  She has associated dizziness.  She denies any vision changes.  She denies any chest pain, palpitations or SOB.  She denies any fever or chills.  She has been prescribed DAPT with ASA and clopidogrel, but only picked up a 30 day supply from her pharmacy since March.  Glucose on admission was 240.  Meds: Current Facility-Administered Medications  Medication Dose Route Frequency Provider Last Rate Last Dose  . aspirin chewable tablet 81 mg  81 mg Oral Daily Caryl Pina, MD      . atorvastatin (LIPITOR) tablet 40 mg  40 mg Oral q1800 Heywood Iles V, MD      . clopidogrel (PLAVIX) tablet 75 mg  75 mg Oral Daily Caryl Pina, MD      . diazepam (VALIUM) tablet 5 mg  5 mg Oral Q12H PRN Heywood Iles V, MD      . DULoxetine (CYMBALTA) DR capsule 60 mg  60 mg Oral BID Patel, Rushil V, MD      . insulin aspart (novoLOG) injection 0-15 Units  0-15 Units Subcutaneous TID WC Patel, Rushil V, MD      .  lisinopril (PRINIVIL,ZESTRIL) tablet 5 mg  5 mg Oral Daily Beather Arbour, MD       No current outpatient prescriptions on file.    Allergies: Allergies as of 07/19/2016 - Review Complete 07/19/2016  Allergen Reaction Noted  . Doxycycline Nausea And Vomiting 07/19/2016  . Penicillins Nausea And Vomiting 07/19/2016  . Tomato Other (See Comments) 07/19/2016   Past Medical History:  Diagnosis Date  . Arthritis   . Diabetes mellitus without complication (HCC)   . GERD (gastroesophageal reflux disease)   . H. pylori infection   . Hyperlipidemia   . Hypertension   . Neuropathy   . Seizures (HCC)   . Stroke [facility])    No past surgical history on file. No family history on file.   Social History   Social History  . Marital status: Divorced    Spouse name: N/A  . Number of children: N/A  . Years of education: N/A   Occupational History  . Not on file.   Social History Main Topics  . Smoking status: Former Games developer  . Smokeless tobacco: Never Used  .  Alcohol use Yes  . Drug use: No  . Sexual activity: Not on file   Other Topics Concern  . Not on file   Social History Narrative  . No narrative on file    Review of Systems: Pertinent items are noted in HPI.  Physical Exam: Blood pressure 138/78, pulse 76, temperature 98.4 F (36.9 C), resp. rate 17, SpO2 98 %. General appearance: cooperative and moderately obese, oriented to person, place, and time.  Sedated with slurred speech. Head: Normocephalic, without obvious abnormality, atraumatic Eyes: visual field defect right side. PERRL Lungs: clear to auscultation bilaterally Heart: regular rate and rhythm, S1, S2 normal, no murmur, click, rub or gallop Extremities: extremities normal, atraumatic, no cyanosis or edema Neurologic: Mental status: Alert, oriented, thought content appropriate, alertness: alert Cranial nerves: II: visual acuity right visual field cut bilaterally    III, VI, IV: Fast beating nystagmus to the  right. Motor: Muscle strength equal bilaterally in upper and lower extremities.  Lab results: CBC Latest Ref Rng & Units 07/19/2016  WBC 4.0 - 10.5 K/uL 12.1(H)  Hemoglobin 12.0 - 15.0 g/dL 61.6  Hematocrit 07.3 - 46.0 % 39.3  Platelets 150 - 400 K/uL 279    BMP Latest Ref Rng & Units 07/19/2016  Glucose 65 - 99 mg/dL 710(G)  BUN 6 - 20 mg/dL 10  Creatinine 2.69 - 4.85 mg/dL 4.62  Sodium 703 - 500 mmol/L 136  Potassium 3.5 - 5.1 mmol/L 4.0  Chloride 101 - 111 mmol/L 104  CO2 22 - 32 mmol/L 22  Calcium 8.9 - 10.3 mg/dL 9.3(G)     Imaging results:      Assessment & Plan by Problem: Active Problems:   CVA (cerebral vascular accident) [facility])  Tonya Sanders is a 56 yo woman with a history of stroke who presents with left sided headache and nausea/vomiting.   Acute Right Lateral Cerebellar Infarct: Patient presents with headache and associated nausea.  She had continued symptoms in the ED, leading to imaging of the head.  MRI showed infarct in R lateral cerebellum.  We will continue work up for management of stroke. - Risk stratification, including Hgb A1c, Lipid panel, CT head/neck, evals by PT/OT/speech, echo - Monitor on telemetry - Continue DAPT with ASA and clopidogrel - Diazepam 5mg  PRN for dizziness - Allow permissive HTN, hold home lisinopril  Hyperglycemia: BG on admission was 240.  Patient will be worked up for possible DM - Start sliding scale insulin - A1c  DVT/PE Prophylaxis: Lovenox FEN/GI: Regular Diet Code: Full    This is a Psychologist, occupational Note.  The care of the patient was discussed with Dr. Ladona Ridgel and the assessment and plan was formulated with their assistance.  Please see their note for official documentation of the patient encounter.   Signed: Mikal Sanders, Medical Student 07/19/2016, 3:30 PM

## 2016-07-19 NOTE — ED Provider Notes (Signed)
Medical screening examination/treatment/procedure(s) were conducted as a shared visit with non-physician practitioner(s) and myself.  I personally evaluated the patient during the encounter. Briefly, the patient is a 56 y.o. female who presented to the emergency department for nausea and vomiting. Exam was concerning for central process. CT scan revealed possible new infarct which was confirmed by MRI. Patient admitted for further workup and management.    EKG Interpretation  Date/Time:  Wednesday July 19 2016 07:53:50 EDT Ventricular Rate:  82 PR Interval:    QRS Duration: 101 QT Interval:  402 QTC Calculation: 470 R Axis:   -25 Text Interpretation:  Sinus rhythm Probable left atrial enlargement RSR' in V1 or V2, right VCD or RVH Inferior infarct, old Baseline wander in lead(s) I III aVR aVL V1 Otherwise no significant change Confirmed by Drema Pry [phone]) on 07/19/2016 8:41:00 AM           Eudelia Bunch Amadeo Garnet, MD 07/19/16 1505

## 2016-07-19 NOTE — Consult Note (Signed)
Referring Physician: Dr. Evette Doffing    Chief Complaint: Acute right cerebellar stroke seen on MRI brain  HPI: Tonya Sanders is an 56 y.o. female who presented with worsening of N/V with 5 episodes of emesis beginning at 0430 this morning. In triage, she continued to vomit and was also complaining of headache and spinning dizziness. She had been seen at [facility] yesterday for the same symptoms. She was hyperglycemic and stated that her sugars have been running high over the last few days.   CT head was obtained, revealing an age indeterminate right cerebellar infarction. An MRI brain was then obtained, revealing a subacute right lateral cerebellar infarction of moderate size. Chronic infarctions were also noted.    At the time of Neurological consultation, she endorses a left sided 9/10 headache, vertigo and malaise. She has a pre-existing right visual field cut from a prior stroke. Denies chest pain or abdominal pain. Also with no neck pain. Denies recent illness and has had no fever or chills. Also no SOB.   Her PMHx includes arthritis, GERD, HLD, HTN, neuropathy, seizures and prior stroke. Home medications include ASA and Plavix.   Past Medical History:  Diagnosis Date  . Arthritis   . Diabetes mellitus without complication ([place])   . GERD (gastroesophageal reflux disease)   . H. pylori infection   . Hyperlipidemia   . Hypertension   . Neuropathy   . Seizures ([place])   . Stroke [facility])     No past surgical history on file.  No family history on file. Social History:  reports that she has quit smoking. She has never used smokeless tobacco. She reports that she drinks alcohol. She reports that she does not use drugs.  Allergies:  Allergies  Allergen Reactions  . Doxycycline Nausea And Vomiting  . Penicillins Nausea And Vomiting    Has patient had a PCN reaction causing immediate rash, facial/tongue/throat swelling, SOB or lightheadedness with hypotension: No Has patient had a PCN  reaction causing severe rash involving mucus membranes or skin necrosis: No Has patient had a PCN reaction that required hospitalization: No Has patient had a PCN reaction occurring within the last 10 years: No If all of the above answers are "NO", then may proceed with Cephalosporin use.   . Tomato Other (See Comments)    unspecified    Medications:  No outpatient prescriptions have been marked as taking for the 07/19/16 encounter [facility] Encounter).    ROS: As per HPI.   Physical Examination: Blood pressure 135/84, pulse 78, temperature 98.4 F (36.9 C), resp. rate 18, SpO2 95 %.  HEENT: [place]/AT. Edentulous.  Lungs: Respirations unlabored. No gross wheezing.  Ext: No edema.   Neurologic Examination: Mental Status: Drowsy. Fully oriented. Speech fluent with intact comprehension and naming.  Able to demonstrate comprehension of all commands. Dysthymic affect. Poor compliance with portions of the exam.  Cranial Nerves: II:  Right visual field cut. PERRL.  III,IV, VI: ptosis not present, eyes deviated towards the left at baseline; when attempting to gaze conjugately towards the right, there is slow drift back to the left with fast beating nystagmus to the right. Able to cross midline to right with difficulty. V,VII: smile symmetric, facial temp sensation equal bilaterally VIII: hearing intact to questions and commands IX,X: no hypophonia or hoarseness XI: no asymmetry XII: midline tongue extension  Motor: Right : Upper extremity   4+/5    Left:     Upper extremity   4+/5  Lower extremity  3-4/5 with poor effort  Lower extremity   3-4/5 with poor effort Normal tone throughout; no atrophy noted Sensory: Decreased temperature sensation on the right.  Deep Tendon Reflexes:  1+ bilateral brachioradialis, biceps and patellae.  Plantars: Right: downgoing  Left: downgoing Cerebellar: Slower on right than left with FNF; subtle right sided dysmetria Gait: Deferred  Results for orders  placed or performed during the hospital encounter of 07/19/16 (from the past 48 hour(s))  Basic metabolic panel     Status: Abnormal   Collection Time: 07/19/16  6:59 AM  Result Value Ref Range   Sodium 136 135 - 145 mmol/L   Potassium 4.0 3.5 - 5.1 mmol/L   Chloride 104 101 - 111 mmol/L   CO2 22 22 - 32 mmol/L   Glucose, Bld 240 (H) 65 - 99 mg/dL   BUN 10 6 - 20 mg/dL   Creatinine, Ser 0.61 0.44 - 1.00 mg/dL   Calcium 8.8 (L) 8.9 - 10.3 mg/dL   GFR calc non Af Amer >60 >60 mL/min   GFR calc Af Amer >60 >60 mL/min    Comment: (NOTE) The eGFR has been calculated using the CKD EPI equation. This calculation has not been validated in all clinical situations. eGFR's persistently <60 mL/min signify possible Chronic Kidney Disease.    Anion gap 10 5 - 15  CBC     Status: Abnormal   Collection Time: 07/19/16  6:59 AM  Result Value Ref Range   WBC 12.1 (H) 4.0 - 10.5 K/uL   RBC 4.16 3.87 - 5.11 MIL/uL   Hemoglobin 13.5 12.0 - 15.0 g/dL   HCT 39.3 36.0 - 46.0 %   MCV 94.5 78.0 - 100.0 fL   MCH 32.5 26.0 - 34.0 pg   MCHC 34.4 30.0 - 36.0 g/dL   RDW 12.0 11.5 - 15.5 %   Platelets 279 150 - 400 K/uL  I-stat troponin, ED     Status: None   Collection Time: 07/19/16  7:36 AM  Result Value Ref Range   Troponin i, poc 0.02 0.00 - 0.08 ng/mL   Comment 3            Comment: Due to the release kinetics of cTnI, a negative result within the first hours of the onset of symptoms does not rule out myocardial infarction with certainty. If myocardial infarction is still suspected, repeat the test at appropriate intervals.   I-Stat Venous Blood Gas, ED (order at [facility] and [facility] only)     Status: None   Collection Time: 07/19/16  7:38 AM  Result Value Ref Range   pH, Ven 7.376 7.250 - 7.430   pCO2, Ven 45.9 44.0 - 60.0 mmHg   pO2, Ven 43.0 32.0 - 45.0 mmHg   Bicarbonate 26.9 20.0 - 28.0 mmol/L   TCO2 28 0 - 100 mmol/L   O2 Saturation 77.0 %   Acid-Base Excess 1.0 0.0 - 2.0 mmol/L   Patient  temperature HIDE    Sample type VENOUS   CBG monitoring, ED     Status: Abnormal   Collection Time: 07/19/16  7:56 AM  Result Value Ref Range   Glucose-Capillary 221 (H) 65 - 99 mg/dL   Comment 1 Notify RN    Comment 2 Document in Chart   Urinalysis, Routine w reflex microscopic     Status: Abnormal   Collection Time: 07/19/16  8:10 AM  Result Value Ref Range   Color, Urine STRAW (A) YELLOW   APPearance CLEAR CLEAR  Specific Gravity, Urine 1.013 1.005 - 1.030   pH 7.0 5.0 - 8.0   Glucose, UA >=500 (A) NEGATIVE mg/dL   Hgb urine dipstick NEGATIVE NEGATIVE   Bilirubin Urine NEGATIVE NEGATIVE   Ketones, ur 20 (A) NEGATIVE mg/dL   Protein, ur NEGATIVE NEGATIVE mg/dL   Nitrite NEGATIVE NEGATIVE   Leukocytes, UA NEGATIVE NEGATIVE   RBC / HPF NONE SEEN 0 - 5 RBC/hpf   WBC, UA 0-5 0 - 5 WBC/hpf   Bacteria, UA NONE SEEN NONE SEEN   Squamous Epithelial / LPF 0-5 (A) NONE SEEN   Mucous PRESENT    No results found.  Assessment: 56 y.o. female with acute right cerebellar ischemic infarction.  1. States she is taking ASA and Plavix, and that she is compliant. Therefore, classifiable as having failed DAPT.  2. DDx for underlying etiology of stroke: Right vertebral dissection, artery to artery embolization, in-situ thrombosis and cardioembolic stroke.  3. Old right cerebellar and left occipital lobe strokes are also seen on MRI.  3. Stroke Risk Factors - DM, HLD, HTN, prior stroke 4. History of seizures  Plan: 1. HgbA1c, fasting lipid panel 2. CTA of head and neck to evaluate for possible dissection, stenosis or occlusion 3. PT consult, OT consult, Speech consult 4. Echocardiogram 5. Continue ASA and Plavix 6. Risk factor modification 7. Telemetry monitoring 8. Frequent neuro checks 9. Stroke-in-the-young work up to include hypercoagulable panel.  10. Contact her outpatient pharmacy for full list of her home meds. None listed in EPIC. 11. If not on a statin, start atorvastatin 40 mg  po qd and obtain baseline AST/ALT and CK level. 12. May need to be on a seizure medication given listed history of epilepsy. See #10 above. The patient is not forthcoming and somewhat oppositional when asked about her home medications. Seizure precautions for now.   _0  signed: Dr. Kerney Elbe 07/19/2016, 3:00 PM

## 2016-07-19 NOTE — ED Notes (Signed)
IV team at bedside 

## 2016-07-19 NOTE — Progress Notes (Signed)
Received patient to room 12; patient placed in bed, placed on monitor, no s/sx of distress. Awake, alert/oreinted x 4.

## 2016-07-19 NOTE — H&P (Signed)
Date: 07/20/2016               Patient Name:  Tonya Sanders MRN: [id number]  DOB: 1960/03/19 Age / Sex: 56 y.o., female   PCP: Patient, No Pcp Per         Medical Service: Internal Medicine Teaching Service         Attending Physician: Dr. Oswaldo Done, Marquita Palms, *    First Contact: Dr. Thomasene Lot Pager: [phone]  Second Contact: Dr. Darreld Mclean Pager: [phone]       After Hours (After 5p/  First Contact Pager: [phone]  weekends / holidays): Second Contact Pager: [phone]   Chief Complaint: Nausea, vomiting and headache   History of Present Illness: The patient is a 56 year old female with a medical history of GERD, arthritis, hyperlipidemia, hypertension, neuropathy, seizures and prior CVA who presents with nausea, vomiting and headache.She had continued emesis and dizziness in the emergency department prompting head CT.  Head CT was concerning for an age indeterminate small right cerebellar infarct. Radiology recommended f/u MRI. MRI results show acute infarct in the lateral right cerebellum as well as a small remote right cerebellar infarct of undetermined age. Neurology has been consulted. Patient's risk factors include hypertension, hyperlipidemia and prior CVA. She will be admitted to IMTS for stroke workup.  Additional history was not able to be obtained as the patient was extremely drowzy and sedated after receiving benadryl, ativan, promethazine and Reglan. She did say her n/v and dizziness had improved after receiving those medicines.   In the emergency department the patient was hypertensive but hemodynamically stable. Basic metabolic panel shows hyperglycemia with a glucose of 240. CBC unremarkable. I-STAT troponin negative. Imaging as documented above. The patient was given diphenhydramine, lorazepam, metoclopramide and promethazine with improvement of her symptoms. She will be admitted to IMTS for stroke workup and symptom management.   Meds:  Unclear medication  list. Patient unable to discuss which medications she takes on a daily basis. The pharmacy contacted says she has not filled several of her medications including her DAPT. Will need further clarification today if the patient is more alert and oriented. Will also need to clarify further what medications she has filled or not that have been prescribed.   Allergies: Allergies as of 07/19/2016 - Review Complete 07/19/2016  Allergen Reaction Noted  . Doxycycline Nausea And Vomiting 07/19/2016  . Penicillins Nausea And Vomiting 07/19/2016  . Tomato Other (See Comments) 07/19/2016   Past Medical History:  Diagnosis Date  . Anxiety   . Arthritis    "all over" (07/19/2016)  . Asthma   . Bipolar disorder (HCC)   . Chronic bronchitis (HCC)   . Chronic lower back pain   . Depression   . GERD (gastroesophageal reflux disease)   . H. pylori infection   . Hyperlipidemia   . Hypertension   . Migraine    "a few/month" (07/19/2016)  . Myocardial infarction (HCC) 2016  . Neuropathy   . Neuropathy    Hattie Perch 07/19/2016  . PUD (peptic ulcer disease)    hx/notes 06/22/2010  . Seizures (HCC)   . Stroke [facility])    "I've had several"; denies residual (07/19/2016)  . Type II diabetes mellitus (HCC)     Family History: Dementia- Mother  Social History: Smokes 1 cigarette per year. Denies alcohol or drug use.  Review of Systems: A complete ROS was negative except as per HPI.   Physical Exam: Blood pressure (!) 153/82, pulse 78, temperature  98.6 F (37 C), temperature source Oral, resp. rate 16, height 5\' 4"  (1.626 m), weight 226 lb 6.4 oz (102.7 kg), SpO2 96 %. Physical Exam  Constitutional: She is oriented to person, place, and time. She appears well-developed and well-nourished.  Patient somewhat sedated with slurring of speech  HENT:  Head: Normocephalic and atraumatic.  Eyes: Pupils are equal, round, and reactive to light.  Right visual field cut  Cardiovascular: Normal rate and regular rhythm.   Exam reveals no friction rub.   No murmur heard. Respiratory: Effort normal and breath sounds normal. No respiratory distress. She has no wheezes.  GI: Soft. Bowel sounds are normal. She exhibits no distension.  Musculoskeletal: She exhibits no edema.  Neurological: She is alert and oriented to person, place, and time. A cranial nerve deficit is present.  Visual field cut. Fast beating nystagmus to the right. Muscle strength equal in the upper extremities bilaterally. Muscle strength equal in the lower extremities.     EKG: No acute ST segment elevation  CXR: Low lung volumes. No evidence of pneumonia or overt pulmonary edema.   Assessment & Plan by Problem: Active Problems:   CVA (cerebral vascular accident) (HCC)   # Acute right lateral cerebellar infarct ## Hx of CVA MRI confirmed acute right lateral cerebellar infarct. Neurology consulted. Patient's risk factors include hypertension, hyperlipidemia and prior CVA. We'll proceed with stroke workup. Patient on dual antiplatelet therapy with aspirin and clopidogrel at time of presentation. Thus she has failed dual antiplatelet therapy. -- Neurology consulted -- Stroke workup- hemoglobin A1c, fasting lipid panel, CT head and neck, PT, OT and speech consult, echocardiogram, telemetry -- Follow-up stroke workup to decide on secondary prevention strategies -- Continue aspirin and Plavix -- Diazepam 5 mg by mouth every 12 hours when necessary dizziness  # Hypertension ## Hypercholesterolemia  Hold antihypertensive medications in the setting of acute CVA. Allow permissive hypertension with a goal blood pressure of less than 220/110. -- Permissive hypertension, gradually normalized over 5-7 days  # Diabetes  -- Sliding Scale Insulin  -- Hg A1c  # Seizures  Will need to obtain full list of medications and resume home meds once verified.  -- Seizure precautions   DVT/PE prophylaxis: Lovenox FEN/GI: Regular diet Code: Full  code   Dispo: Admit patient to Inpatient with expected length of stay greater than 2 midnights.  Signed: Thomasene Lot, MD 07/20/2016, 7:58 AM  Pager: [phone]

## 2016-07-20 ENCOUNTER — Inpatient Hospital Stay (HOSPITAL_COMMUNITY): Payer: Medicare Other

## 2016-07-20 DIAGNOSIS — I639 Cerebral infarction, unspecified: Secondary | ICD-10-CM

## 2016-07-20 LAB — LIPID PANEL
CHOL/HDL RATIO: 5.5 ratio
CHOLESTEROL: 155 mg/dL (ref 0–200)
HDL: 28 mg/dL — ABNORMAL LOW (ref >40)
LDL Cholesterol: 77 mg/dL (ref 0–99)
TRIGLYCERIDES: 250 mg/dL — AB (ref <150)
VLDL: 50 mg/dL — AB (ref 0–40)

## 2016-07-20 LAB — CBC
HCT: 39.8 % (ref 36.0–46.0)
Hemoglobin: 13.2 g/dL (ref 12.0–15.0)
MCH: 31.4 pg (ref 26.0–34.0)
MCHC: 33.2 g/dL (ref 30.0–36.0)
MCV: 94.5 fL (ref 78.0–100.0)
PLATELETS: 311 10*3/uL (ref 150–400)
RBC: 4.21 MIL/uL (ref 3.87–5.11)
RDW: 12.1 % (ref 11.5–15.5)
WBC: 8.1 10*3/uL (ref 4.0–10.5)

## 2016-07-20 LAB — GLUCOSE, CAPILLARY
GLUCOSE-CAPILLARY: 281 mg/dL — AB (ref 65–99)
GLUCOSE-CAPILLARY: 235 mg/dL — AB (ref 65–99)
Glucose-Capillary: 236 mg/dL — ABNORMAL HIGH (ref 65–99)
Glucose-Capillary: 271 mg/dL — ABNORMAL HIGH (ref 65–99)

## 2016-07-20 LAB — HIV ANTIBODY (ROUTINE TESTING W REFLEX): HIV Screen 4th Generation wRfx: NONREACTIVE

## 2016-07-20 MED ORDER — SODIUM CHLORIDE 0.9% FLUSH
10.0000 mL | INTRAVENOUS | Status: DC | PRN
  Administered 2016-07-21 – 2016-07-26 (×2): 10 mL
  Filled 2016-07-20 (×2): qty 40

## 2016-07-20 MED ORDER — GLUCERNA SHAKE PO LIQD
237.0000 mL | Freq: Three times a day (TID) | ORAL | Status: DC
  Administered 2016-07-20 – 2016-07-26 (×15): 237 mL via ORAL
  Filled 2016-07-20 (×2): qty 237

## 2016-07-20 MED ORDER — INSULIN GLARGINE 100 UNIT/ML ~~LOC~~ SOLN
15.0000 [IU] | Freq: Every day | SUBCUTANEOUS | Status: DC
  Administered 2016-07-20 – 2016-07-21 (×2): 15 [IU] via SUBCUTANEOUS
  Filled 2016-07-20 (×3): qty 0.15

## 2016-07-20 MED ORDER — IOPAMIDOL (ISOVUE-370) INJECTION 76%
INTRAVENOUS | Status: AC
  Administered 2016-07-20: 50 mL
  Filled 2016-07-20: qty 50

## 2016-07-20 NOTE — Evaluation (Signed)
Physical Therapy Evaluation Patient Details Name: Tonya Sanders MRN: [id number] DOB: [birth year] Today's Date: 6/14/20183   History of Present Illness  Pt is a 56 y/o female with a history of stroke (with right visual field cut) who presents with an acute headache, spinning, and nausea. MRI revealed acute infarct in the lateral right cerebellum, Remote left PCA territory infarct affecting the occipital lobe.  Clinical Impression  Pt admitted secondary to problem above with deficits below. PTA, pt was independent with functional mobility with occasional use of a cane. Has a R visual field cut at baseline. Upon evaluation, pt presenting with decreased steadiness, R visual field deficits, cognitive issues, and improper use of DME. Attempted to educate pt about use of cane during mobility, however, pt continued to use improperly. Required min guard to min A for mobility secondary to deficits above. Recommending neuro outpatient PT to increase independence with functional mobility at d/c. Will need to reassess pt during next session to determine DME needs. Will continue to follow acutely.     Follow Up Recommendations Outpatient PT;Supervision/Assistance - 24 hour    Equipment Recommendations  Other (comment) (TBD based on pt improvement)    Recommendations for Other Services       Precautions / Restrictions Precautions Precautions: Fall Precaution Comments: R visual field cut Restrictions Weight Bearing Restrictions: No      Mobility  Bed Mobility Overal bed mobility: Modified Independent             General bed mobility comments: HOB elevated, use of rails to assist  Transfers Overall transfer level: Needs assistance Equipment used: None Transfers: Sit to/from Stand Sit to Stand: Min assist         General transfer comment: min A for balance/steadying. Verbal cues to wait for PT before standing.   Ambulation/Gait Ambulation/Gait assistance: Min assist Ambulation  Distance (Feet): 125 Feet Assistive device: Quad cane;None Gait Pattern/deviations: Step-through pattern;Decreased stride length;Drifts right/left;Trunk flexed;Staggering right Gait velocity: Decreased Gait velocity interpretation: Below normal speed for age/gender General Gait Details: Decreased steadiness with gait. Used cane initially secondary to OT reports of improper use with RW. Pt also with improper use of cane, and did not follow commands about appropriate use. Pt not demonstrating safe technique with cane, therefore attempted without AD. Pt drifts to R and unsteady with horizontal head turns. Running into obstacles on R despite safety cues.   Stairs            Wheelchair Mobility    Modified Rankin (Stroke Patients Only) Modified Rankin (Stroke Patients Only) Pre-Morbid Rankin Score: Slight disability Modified Rankin: Moderately severe disability     Balance Overall balance assessment: Needs assistance Sitting-balance support: No upper extremity supported;Feet supported Sitting balance-Leahy Scale: Good     Standing balance support: Single extremity supported;During functional activity Standing balance-Leahy Scale: Poor Standing balance comment: Reliant on external support for steadying.                              Pertinent Vitals/Pain Pain Assessment: No/denies pain    Home Living Family/patient expects to be discharged to:: Private residence Living Arrangements: Spouse/significant other Available Help at Discharge: Family;Available PRN/intermittently Type of Home: Apartment Home Access: Stairs to enter Entrance Stairs-Rails: Right Entrance Stairs-Number of Steps: flight Home Layout: One level Home Equipment: None      Prior Function Level of Independence: Independent with assistive device(s);Independent         Comments: reports  some use of cane, but did not always use     Hand Dominance   Dominant Hand: Right    Extremity/Trunk  Assessment   Upper Extremity Assessment Upper Extremity Assessment: Defer to OT evaluation    Lower Extremity Assessment Lower Extremity Assessment: Generalized weakness (Grossly 4/5 throughout )    Cervical / Trunk Assessment Cervical / Trunk Assessment: Other exceptions Cervical / Trunk Exceptions: forward head and rounded shoulders  Communication   Communication: No difficulties  Cognition Arousal/Alertness: Awake/alert Behavior During Therapy: WFL for tasks assessed/performed Overall Cognitive Status: History of cognitive impairments - at baseline Area of Impairment: Safety/judgement                         Safety/Judgement: Decreased awareness of safety;Decreased awareness of deficits     General Comments: Pt with prior CVA. Pt with decreased safety awareness and required multiple cues for safety with mobility and cues to wait for PT to perform mobility tasks.       General Comments General comments (skin integrity, edema, etc.): Pt reporting she did not do well while using AD before admitted to hospital. Will need to re-evaluate during next session most appropriate AD.     Exercises     Assessment/Plan    PT Assessment Patient needs continued PT services  PT Problem List Decreased balance;Decreased mobility;Decreased knowledge of use of DME;Decreased safety awareness;Decreased knowledge of precautions;Decreased cognition       PT Treatment Interventions DME instruction;Stair training;Gait training;Functional mobility training;Therapeutic activities;Therapeutic exercise;Balance training;Neuromuscular re-education;Patient/family education    PT Goals (Current goals can be found in the Care Plan section)  Acute Rehab PT Goals Patient Stated Goal: to get vision back to normal PT Goal Formulation: With patient Time For Goal Achievement: 07/27/16 Potential to Achieve Goals: Good    Frequency Min 4X/week   Barriers to discharge        Co-evaluation                AM-PAC PT "6 Clicks" Daily Activity  Outcome Measure Difficulty turning over in bed (including adjusting bedclothes, sheets and blankets)?: A Little Difficulty moving from lying on back to sitting on the side of the bed? : A Little Difficulty sitting down on and standing up from a chair with arms (e.g., wheelchair, bedside commode, etc,.)?: Total Help needed moving to and from a bed to chair (including a wheelchair)?: A Little Help needed walking in hospital room?: A Little Help needed climbing 3-5 steps with a railing? : A Lot 6 Click Score: 15    End of Session Equipment Utilized During Treatment: Gait belt Activity Tolerance: Patient tolerated treatment well Patient left: in bed;with call bell/phone within reach;with bed alarm set;with family/visitor present Nurse Communication: Mobility status PT Visit Diagnosis: Unsteadiness on feet (R26.81);Other abnormalities of gait and mobility (R26.89);Other symptoms and signs involving the nervous system (R29.898)    Time: 0981-1914 PT Time Calculation (min) (ACUTE ONLY): 19 min   Charges:     PT Treatments $Gait Training: 8-22 mins   PT G Codes:        Gladys Damme, PT, DPT  Acute Rehabilitation Services  Pager: [phone]   Lehman Prom 07/20/2016, 4:33 PM

## 2016-07-20 NOTE — Progress Notes (Signed)
Inpatient Diabetes Program Recommendations  AACE/ADA: New Consensus Statement on Inpatient Glycemic Control (2015)  Target Ranges:  Prepandial:   less than 140 mg/dL      Peak postprandial:   less than 180 mg/dL (1-2 hours)      Critically ill patients:  140 - 180 mg/dL   Results for PRISHA, SALVATI (MRN [id number]) as of 07/20/2016 12:48  Ref. Range 07/19/2016 07:56 07/19/2016 21:03 07/20/2016 06:07 07/20/2016 11:25  Glucose-Capillary Latest Ref Range: 65 - 99 mg/dL 026 (H) 378 (H) 588 (H) 271 (H)   Review of Glycemic Control  Diabetes history:  Current orders for Inpatient glycemic control: Novolog Moderate 0-15 units tid  Inpatient Diabetes Program Recommendations:    Patient checking glucose at home but no prior hx her and her PCP are following her glucose closely. A1c in process, patient receiving Ensure supplement.  Patient on Regular diet, please change diet to carb modified diet while inpatient. Will follow patient while here.  Consider low dose basal insulin while here inpatient, Lantus 10 units Q24 hours.  Thanks,  Christena Deem RN, MSN, [facility] Inpatient Diabetes Coordinator Team Pager [phone] (8a-5p)

## 2016-07-20 NOTE — Progress Notes (Signed)
Subjective: Patient reports continued dizziness, but improved from yesterday.  Her headache and nausea have improved.  Objective: Vital signs in last 24 hours: Vitals:   07/20/16 0000 07/20/16 0200 07/20/16 0400 07/20/16 0600  BP: 139/65 (!) 174/88 (!) 159/87 (!) 153/82  Pulse: 85 81 79 78  Resp: 16 18 18 16   Temp: 98.7 F (37.1 C) 98.5 F (36.9 C) 98.5 F (36.9 C) 98.6 F (37 C)  TempSrc: Oral Oral Oral Oral  SpO2: 94% 96% 94% 96%  Weight:      Height:        Intake/Output Summary (Last 24 hours) at 07/20/16 1034 Last data filed at 07/19/16 1132  Gross per 24 hour  Intake              999 ml  Output                0 ml  Net              999 ml    Physical Exam: GEN: well appearing, tired, in no acute distress HEENT: PERRL CV: regular rate and rhythm RESP: lungs CTAB ABD: soft, normal bowel sounds NEURO: pronounced nystagmus to the right when looking to the right, less nystagmus downwards when looking to the left.   Lab Results: CBC Latest Ref Rng & Units 07/20/2016 07/19/2016  WBC 4.0 - 10.5 K/uL 8.1 12.1(H)  Hemoglobin 12.0 - 15.0 g/dL 16.1 09.6  Hematocrit 04.5 - 46.0 % 39.8 39.3  Platelets 150 - 400 K/uL 311 279    BMP Latest Ref Rng & Units 07/19/2016  Glucose 65 - 99 mg/dL 409(W)  BUN 6 - 20 mg/dL 10  Creatinine 1.19 - 1.47 mg/dL 8.29  Sodium 562 - 130 mmol/L 136  Potassium 3.5 - 5.1 mmol/L 4.0  Chloride 101 - 111 mmol/L 104  CO2 22 - 32 mmol/L 22  Calcium 8.9 - 10.3 mg/dL 8.6(V)    Studies/Results: Dg Chest 2 View  Result Date: 07/19/2016 CLINICAL DATA:  Pt c/o NV x 5 episodes tonight with high blood sugar. Also c/o headache and dizziness. Hx of htn, diabetes, and asthma EXAM: CHEST  2 VIEW COMPARISON:  Chest x-ray dated 05/17/2006. FINDINGS: Study is slightly hypoinspiratory with crowding of the perihilar and bibasilar bronchovascular markings. Given the low lung volumes, lungs are clear, perhaps mild interstitial thickening. No pleural effusion or  pneumothorax seen. Heart size and mediastinal contours are within normal limits. No acute or suspicious osseous finding. IMPRESSION: Low lung volumes. Perhaps mild interstitial thickening/edema. No evidence of pneumonia or overt alveolar pulmonary edema. Electronically Signed   By: Bary Richard M.D.   On: 07/19/2016 07:45   Ct Head Wo Contrast  Result Date: 07/19/2016 CLINICAL DATA:  Nausea, vomiting, and dizziness. EXAM: CT HEAD WITHOUT CONTRAST TECHNIQUE: Contiguous axial images were obtained from the base of the skull through the vertex without intravenous contrast. COMPARISON:  07/27/2006 FINDINGS: Brain: Left PCA distribution infarct with well-defined low-density and ex vacuo enlargement of the posterior left lateral ventricle. This has a chronic appearance. There is a smaller well-defined low-density in the inferior peripheral right cerebellum without definite volume loss, age indeterminate infarct. A lacunar infarct is suspected in the right thalamus. Negative for hemorrhage or hydrocephalus. Vascular: Mild carotid siphon calcification.  No hyperdense vessel. Skull: Negative Sinuses/Orbits: No acute finding. Notable rightward nasal septal spurring. IMPRESSION: 1. Age-indeterminate small right cerebellar infarct. Provided history suggests recent infarct, suggest MR correlation. 2. Age-indeterminate right thalamus lacunar infarct. 3. Remote  left PCA distribution infarct, new from 2008. Electronically Signed   By: Marnee Spring M.D.   On: 07/19/2016 09:30   Mr Brain Wo Contrast  Result Date: 07/19/2016 CLINICAL DATA:  Question of new right cerebral infarct. EXAM: MRI HEAD WITHOUT CONTRAST TECHNIQUE: Multiplanar, multiecho pulse sequences of the brain and surrounding structures were obtained without intravenous contrast. COMPARISON:  Head CT from earlier today. FINDINGS: Brain: Moderate volume of restricted diffusion in the lateral right cerebellum, adjacent to a smaller area of remote infarct that was  seen by CT. There are 2 foci of diffusion hyperintensity within the remote left PCA territory infarct affecting the occipital lobe. These are in an area of dense encephalomalacia and are likely chronic/artifactual (there is mineralization based on CT and T1 weighted imaging). No acute hemorrhage. Subcortical gliotic signal along the high frontal parietal convexities, with mild cortex involvement in the posterior frontal lobes. Remote right thalamus lacunar infarct. Normal brain volume. Vascular: Major flow voids are preserved. Skull and upper cervical spine: Negative Sinuses/Orbits: Negative IMPRESSION: 1. Moderate acute infarct in the lateral right cerebellum. 2. Small remote right cerebellar infarct adjacent to #1. 3. Remote left PCA territory infarct affecting the occipital lobe. Superimposed foci of diffusion hyperintensity are likely chronic/artifactual. 4. Symmetric mild subcortical and cortical gliosis along the frontoparietal convexities, possible old watershed infarcts. Electronically Signed   By: Marnee Spring M.D.   On: 07/19/2016 12:38    Medications:  Scheduled Meds: . aspirin  81 mg Oral Daily  . atorvastatin  40 mg Oral q1800  . clopidogrel  75 mg Oral Daily  . DULoxetine  60 mg Oral BID  . enoxaparin (LOVENOX) injection  40 mg Subcutaneous Q24H  . feeding supplement (ENSURE ENLIVE)  237 mL Oral BID BM  . insulin aspart  0-15 Units Subcutaneous TID WC  . lisinopril  5 mg Oral Daily   Continuous Infusions: PRN Meds:.acetaminophen **OR** acetaminophen (TYLENOL) oral liquid 160 mg/5 mL **OR** acetaminophen, diazepam, senna-docusate, sodium chloride flush   Assessment/Plan: Active Problems:   CVA (cerebral vascular accident) [facility])  [name] is a 56 yo woman with a history of stroke, DM, HTN, and HLD who is improving after a right cerebellar stroke yesterday.  Acute Right Lateral Cerebellar Infarct: The patient is clinically improving after stroke yesterday.  She continues to  have residual dizziness.  Will continue to risk stratify. - f/u A1c - Echo - CVA head/neck - PT/OT/speech eval - continue ASA, clopidogrel - continue atorvastatin 40mg  - Diazepam 5mg  PRN for dizziness   DVT/PE prophylaxis:Lovenox FEN/GI:Regular diet Code:Full  Dispo: Anticipated discharge within 2 days.   This is a Psychologist, occupational Note.  The care of the patient was discussed with Dr. Ladona Ridgel and the assessment and plan formulated with their assistance.  Please see their attached note for official documentation of the daily encounter.   LOS: 1 day   Mikal Plane, Medical Student 07/20/2016, 10:34 AM

## 2016-07-20 NOTE — Evaluation (Signed)
Occupational Therapy Evaluation Patient Details Name: Tonya Sanders MRN: [id number] DOB: 1960/10/07 Today's Date: 07/20/2016    History of Present Illness Pt is a 56 y/o female with a history of stroke (with right visual field cut) who presents with an acute headache, spinning, and nausea. MRI revealed acute infarct in the lateral right cerebellum, Remote left PCA territory infarct affecting the occipital lobe.   Clinical Impression   PTA Pt independent in ADL and mobility ("DME gets in my way"). Pt currently min guard for standing ADL and min guard for ambulation with RW (vc for safety). Pt states that the biggest challenge is that her eyes keep "jumping, and it makes it hard to focus". Educated Pt in focused gaze during ambulation. Please see OT problem list below. Pt will benefit from skilled OT in the acute setting to maxmize safety and independence in ADL and functional transfers. Pt will require outpatient OT services to educate Pt and fiance on compensatory strategies for visual deficits/changes. Next session to focus on visual strategies.    Follow Up Recommendations  Outpatient OT;Supervision - Intermittent    Equipment Recommendations  Tub/shower seat (need to practice this with Pt next session - old antique tub)    Recommendations for Other Services       Precautions / Restrictions Precautions Precautions: Fall Precaution Comments: R visual field cut Restrictions Weight Bearing Restrictions: No      Mobility Bed Mobility Overal bed mobility: Modified Independent             General bed mobility comments: HOB elevated, use of rails to assist  Transfers Overall transfer level: Needs assistance Equipment used: Rolling walker (2 wheeled) Transfers: Sit to/from Stand Sit to Stand: Min assist         General transfer comment: min A for balance/steadying, and min A for initial boost    Balance Overall balance assessment: Needs assistance Sitting-balance  support: No upper extremity supported;Feet supported Sitting balance-Leahy Scale: Good Sitting balance - Comments: able to don socks sitting EOB   Standing balance support: Single extremity supported;During functional activity Standing balance-Leahy Scale: Poor Standing balance comment: reliant on external support during ambulation and leaning against sink during grooming                           ADL either performed or assessed with clinical judgement   ADL Overall ADL's : Needs assistance/impaired Eating/Feeding: Sitting;Modified independent   Grooming: Wash/dry face;Wash/dry hands;Min guard;Standing Grooming Details (indicate cue type and reason): sink level Upper Body Bathing: Supervision/ safety;Sitting   Lower Body Bathing: Min guard;Sitting/lateral leans   Upper Body Dressing : Set up;Sitting   Lower Body Dressing: Min guard;Sitting/lateral leans Lower Body Dressing Details (indicate cue type and reason): don socks sitting EOB Toilet Transfer: Min guard;Cueing for safety;Cueing for sequencing;Ambulation;Regular Toilet;Grab bars;RW Statistician Details (indicate cue type and reason): x2 during session, vc for safety with RW Toileting- Clothing Manipulation and Hygiene: Sitting/lateral lean;Supervision/safety Toileting - Clothing Manipulation Details (indicate cue type and reason): front and rear peri care, hospital gown Tub/ Shower Transfer: Moderate assistance;Ambulation   Functional mobility during ADLs: Min guard;Rolling walker;Cueing for safety;Cueing for sequencing General ADL Comments: Pt states that vision is biggest change "I feel like my eyes are jumping"     Vision Baseline Vision/History: Wears glasses (history of R visual field cut) Wears Glasses: At all times Patient Visual Report: Nausea/blurring vision with head movement;Other (comment) (nystagmus "my eye is jumping, focusing  on you is hard") Vision Assessment?: Yes Eye Alignment: Within  Functional Limits Ocular Range of Motion: Within Functional Limits Alignment/Gaze Preference: Chin down;Head tilt Tracking/Visual Pursuits: Decreased smoothness of horizontal tracking;Impaired - to be further tested in functional context Convergence: Within functional limits Visual Fields: Right visual field deficit Diplopia Assessment:  (denies)     Perception     Praxis      Pertinent Vitals/Pain Pain Assessment: 0-10 Pain Score: 4  Pain Location: head Pain Descriptors / Indicators: Headache Pain Intervention(s): Monitored during session;Repositioned     Hand Dominance Right   Extremity/Trunk Assessment Upper Extremity Assessment Upper Extremity Assessment: Overall WFL for tasks assessed (back at baseline per Pt - functional for opening containers)   Lower Extremity Assessment Lower Extremity Assessment: Defer to PT evaluation   Cervical / Trunk Assessment Cervical / Trunk Assessment: Other exceptions Cervical / Trunk Exceptions: forward head and rounded shoulders   Communication Communication Communication: No difficulties   Cognition Arousal/Alertness: Awake/alert Behavior During Therapy: WFL for tasks assessed/performed Overall Cognitive Status: Within Functional Limits for tasks assessed                                     General Comments       Exercises     Shoulder Instructions      Home Living Family/patient expects to be discharged to:: Private residence Living Arrangements: Spouse/significant other Available Help at Discharge: Family;Available PRN/intermittently Type of Home: Apartment Home Access: Stairs to enter Entrance Stairs-Number of Steps: flight Entrance Stairs-Rails: Right Home Layout: One level     Bathroom Shower/Tub: Tub only   Firefighter: Standard Bathroom Accessibility: Yes How Accessible: Accessible via walker Home Equipment: None          Prior Functioning/Environment Level of Independence:  Independent with assistive device(s);Independent                 OT Problem List: Decreased activity tolerance;Impaired balance (sitting and/or standing);Impaired vision/perception;Decreased knowledge of use of DME or AE      OT Treatment/Interventions: Self-care/ADL training;Neuromuscular education;Visual/perceptual remediation/compensation;DME and/or AE instruction;Patient/family education;Balance training    OT Goals(Current goals can be found in the care plan section) Acute Rehab OT Goals Patient Stated Goal: to get vision back to normal OT Goal Formulation: With patient Time For Goal Achievement: 08/03/16 Potential to Achieve Goals: Good ADL Goals Pt Will Perform Grooming: with modified independence;standing Pt Will Transfer to Toilet: with modified independence;ambulating;regular height toilet (with RW) Pt Will Perform Toileting - Clothing Manipulation and hygiene: Independently;sit to/from stand Additional ADL Goal #1: Pt will recall and implement 2 compensatory strategies for visual deficit/nystagmus.  OT Frequency: Min 2X/week   Barriers to D/C:    Pt's fiance works 7-3:30 during the day       Co-evaluation              AM-PAC PT "6 Clicks" Daily Activity     Outcome Measure Help from another person eating meals?: None Help from another person taking care of personal grooming?: A Little Help from another person toileting, which includes using toliet, bedpan, or urinal?: None Help from another person bathing (including washing, rinsing, drying)?: A Little Help from another person to put on and taking off regular upper body clothing?: None Help from another person to put on and taking off regular lower body clothing?: A Little 6 Click Score: 21   End of Session Equipment Utilized  During Treatment: Gait belt;Rolling walker Nurse Communication: Mobility status  Activity Tolerance: Patient tolerated treatment well Patient left: in chair;with call bell/phone  within reach;with chair alarm set  OT Visit Diagnosis: Unsteadiness on feet (R26.81);Other symptoms and signs involving the nervous system (R29.898)                Time: 0865-7846 OT Time Calculation (min): 51 min Charges:  OT General Charges $OT Visit: 1 Procedure OT Evaluation $OT Eval Moderate Complexity: 1 Procedure OT Treatments $Self Care/Home Management : 23-37 mins G-Codes:     Sherryl Manges OTR/L [id number] Evern Bio Wessley Emert 07/20/2016, 11:17 AM

## 2016-07-20 NOTE — Progress Notes (Signed)
STROKE TEAM PROGRESS NOTE  SUBJECTIVE (INTERVAL HISTORY) Patient states she has not been taking her medications at home because "they didn't send them to me". Not compliant with ASA or Plavix. Patient does not know if she has seen Neurology in the past, very poor historian or doesn't want to tell me who she has seen in the past for her multiple remote strokes.   OBJECTIVE Temp:  [98.3 F (36.8 C)-98.7 F (37.1 C)] 98.6 F (37 C) (06/14 0600) Pulse Rate:  [76-91] 78 (06/14 0600) Cardiac Rhythm: Normal sinus rhythm (06/14 0700) Resp:  [13-18] 16 (06/14 0600) BP: (129-179)/(65-100) 153/82 (06/14 0600) SpO2:  [93 %-100 %] 96 % (06/14 0600) FiO2 (%):  [0 %] 0 % (06/13 1903) Weight:  [102.7 kg (226 lb 6.4 oz)] 102.7 kg (226 lb 6.4 oz) (06/13 2200)  CBC:  Recent Labs Lab 07/19/16 0659 07/20/16 0420  WBC 12.1* 8.1  HGB 13.5 13.2  HCT 39.3 39.8  MCV 94.5 94.5  PLT 279 311    Basic Metabolic Panel:  Recent Labs Lab 07/19/16 0659  NA 136  K 4.0  CL 104  CO2 22  GLUCOSE 240*  BUN 10  CREATININE 0.61  CALCIUM 8.8*    Lipid Panel:    Component Value Date/Time   CHOL 155 07/20/2016 0420   TRIG 250 (H) 07/20/2016 0420   HDL 28 (L) 07/20/2016 0420   CHOLHDL 5.5 07/20/2016 0420   VLDL 50 (H) 07/20/2016 0420   LDLCALC 77 07/20/2016 0420   HgbA1c: No results found for: HGBA1C Urine Drug Screen: No results found for: LABOPIA, COCAINSCRNUR, LABBENZ, AMPHETMU, THCU, LABBARB  Alcohol Level No results found for: [facility]  IMAGING  Dg Chest 2 View 07/19/2016 Low lung volumes. Perhaps mild interstitial thickening/edema. No evidence of pneumonia or overt alveolar pulmonary edema.   Ct Head Wo Contrast 07/19/2016 1. Age-indeterminate small right cerebellar infarct. Provided history suggests recent infarct, suggest MR correlation. 2. Age-indeterminate right thalamus lacunar infarct. 3. Remote left PCA distribution infarct, new from 2008.   Mr Brain Wo Contrast 07/19/2016  1. Moderate  acute infarct in the lateral right cerebellum. 2. Small remote right cerebellar infarct adjacent to #1. 3. Remote left PCA territory infarct affecting the occipital lobe. Superimposed foci of diffusion hyperintensity are likely chronic/artifactual. 4. Symmetric mild subcortical and cortical gliosis along the frontoparietal convexities, possible old watershed infarcts.    PHYSICAL EXAM   ASSESSMENT/PLAN Tonya Sanders is a 56 y.o. female with history of arthritis, GERD, HLD, HTN, neuropathy, seizures, DM, HLD not compliant with medication and prior stroke presenting with nausea and vomiting, HA and dizziness. She did not receive IV t-PA due to delay in arrival.   I'm sure patient has been worked up in the past for all her remote strokes but she says she can;t remember. She either really can;t remember, possibly being misleading for unknown reason. No notes in epic other than ED notes but appears she has been to [facility] as well.  Stroke:   R cerebellar infarct and multiple previous infarcts. Unclear etiology or who has been evaluating and treating her, I'm sure she has been worked up but she says she doesn't remember.  CT head small R cerebellar infarct, old R thalamic lacune. Old L PCA infarct since 2008.  MRI head mod lateral R cerebellar infarct. Old R cerebellar infarct. Old L PCA infarct. Possible old watershed infarcts frontoparietal convexities  CTA head and neck pending   2D Echo  pending   LDL  77  HgbA1c pending  Lovenox 40 mg sq daily for VTE prophylaxis  Diet regular Room service appropriate? Yes; Fluid consistency: Thin  Prescribed to take aspirin and plavix PTA, pt states she was not compliant with plavix or aspirin 81 mg because he pharmacy did not send them to her. She is now on aspirin 81 mg daily and clopidogrel 75 mg daily  Patient counseled to be compliant with her antithrombotic medications  Ongoing aggressive stroke risk factor management  Therapy  recommendations:  OP OT  Disposition:  pending   Hypertension  Stable  Permissive hypertension (OK if < 220/120) but gradually normalize in 5-7 days  Long-term BP goal normotensive  Hyperlipidemia  Now on lipitor 40mg  daily  LDL 77, goal < 70  Continue statin at discharge  Diabetes type II  Glucoses elevated  HgbA1c pending, goal < 7.0  Uncontrolled  Other Stroke Risk Factors  Former Cigarette smoker  ETOH use, advised to drink no more than 1 drink(s) a day  Obesity, Body mass index is 38.86 kg/m., recommend weight loss, diet and exercise as appropriate   Hx stroke/TIA  Seen at [facility] per pt, do not have access to records.  Other Active Problems  Seizures  GERD  Hospital day # 1  Personally examined patient and images, and have participated in and made any corrections needed to history, physical, neuro exam,assessment and plan as stated above.  I have personally obtained the history, evaluated lab date, reviewed imaging studies and agree with radiology interpretations.    Tonya Dean, MD Stroke Neurology  To contact Stroke Continuity provider, please refer to [email or web address]. After hours, contact General Neurology

## 2016-07-20 NOTE — Progress Notes (Signed)
PT Cancellation Note  Patient Details Name: BLISS CHYNOWETH MRN: [id number] DOB: 05/01/1960   Cancelled Treatment:    Reason Eval/Treat Not Completed: Medical issues which prohibited therapy Pt refusing to participate secondary to increased dizziness. Requested to come back in an hour. Will reattempt as schedule allows.   Gladys Damme, PT, DPT  Acute Rehabilitation Services  Pager: [phone]    Lehman Prom 07/20/2016, 2:51 PM

## 2016-07-20 NOTE — Progress Notes (Addendum)
CTA head and neck revealed evidence of non-occlusive intracerebral thrombosis in this patient with recent stroke, placed order for therapeutic dose Lovenox per pharmacy. Pharmacy called, requiring approval from neurology to give this dosing. Called and discussed the case with overnight neurologist Dr. Roseanne Reno. He reviewed the patient's case and imaging. Reassured that she is on Aspirin/Plavix and stated that she did not seem symptomatic from the thrombosis, preferring to defer decision to day neuro team. Therapeutic dose Lovenox order cancelled.

## 2016-07-20 NOTE — Progress Notes (Signed)
   Subjective: No acute events overnight. Patient feeling some improvement today. No n/v but continues to have dizziness.  Objective:  Vital signs in last 24 hours: Vitals:   07/20/16 0000 07/20/16 0200 07/20/16 0400 07/20/16 0600  BP: 139/65 (!) 174/88 (!) 159/87 (!) 153/82  Pulse: 85 81 79 78  Resp: 16 18 18 16   Temp: 98.7 F (37.1 C) 98.5 F (36.9 C) 98.5 F (36.9 C) 98.6 F (37 C)  TempSrc: Oral Oral Oral Oral  SpO2: 94% 96% 94% 96%  Weight:      Height:       Physical Exam  Constitutional: She is oriented to person, place, and time. She appears well-developed and well-nourished.  obese  HENT:  Head: Normocephalic and atraumatic.  Eyes:  Bi-directional fast beat nystagmus. Right when looking right and left when looking left  Cardiovascular: Normal rate and regular rhythm.  Exam reveals no friction rub.   No murmur heard. Respiratory: Effort normal and breath sounds normal.  GI: Soft.  Neurological: She is alert and oriented to person, place, and time. A cranial nerve deficit is present.     Assessment/Plan:  Active Problems:   CVA (cerebral vascular accident) [facility])  ** The patient states that she gets claustrophobic and has difficulty with imaging studies. We will give her diazepam before her CT angiography today to help with her anxiety**  # Acute right lateral cerebellar infarct ## Hx of CVA Continue with stroke work-up. After further Discussion this morning the patient states the only medicine she has been on over the last month is insulin. She has not been on her aspirin or Plavix.This was confirmed by the pharmacy. Thus her stroke does not represent failure of DAPT. We have restarted these in addition to a statin. We'll continue with stroke workup today and follow recommendations by neurology. -- Neurology consulted -- Stroke workup- hemoglobin A1c, fasting lipid panel, CT head and neck, PT, OT and speech consult, echocardiogram, telemetry -- Follow-up stroke  workup to decide on secondary prevention strategies -- Continue aspirin and Plavix -- Diazepam 5 mg by mouth every 12 hours when necessary dizziness- she may need this as premedication for the CTA head and necl   # Hypertension ## Hypercholesterolemia  Hold antihypertensive medications in the setting of acute CVA. Allow permissive hypertension with a goal blood pressure of less than 220/110. -- Permissive hypertension, gradually normalized over 5-7 days  # Diabetes  -- Sliding Scale Insulin  -- Hg A1c  # Seizures  -- Seizure precautions   DVT/PE prophylaxis: Lovenox FEN/GI: Regular diet Code: Full code   Dispo: Anticipated discharge in approximately 1-2 day(s).   Thomasene Lot, MD 07/20/2016, 11:02 AM Pager: [phone]

## 2016-07-20 NOTE — Progress Notes (Signed)
Initial Nutrition Assessment  DOCUMENTATION CODES:   Obesity unspecified  INTERVENTION:    Glucerna Shake po TID, each supplement provides 220 kcal and 10 grams of protein  NUTRITION DIAGNOSIS:   Inadequate oral intake related to poor appetite as evidenced by per patient/family report  GOAL:   Patient will meet greater than or equal to 90% of their needs  MONITOR:   PO intake, Supplement acceptance, Labs, Weight trends, Skin, I & O's  REASON FOR ASSESSMENT:   Malnutrition Screening Tool  ASSESSMENT:   "56 y/o Female with a history of stroke (with right visual field cut) who presents with an acute headache, spinning, and nausea. MRI revealed acute infarct in the lateral right cerebellum, Remote left PCA territory infarct affecting the occipital lobe." Copied from Occupational Therapy Evaluation note, Sherryl Manges, OT, 07/20/16.  Pt drowsy upon RD visit.  Limited nutrition hx obtained. Holding opened [employer] nutrition supplement; drinking some. Pt did report her appetite is poor.  Not eating well PTA.  Unable to provide details. Labs and medications reviewed. CBG's [phone].  Nutrition focused physical exam completed.  No muscle or subcutaneous fat depletion noticed.  Diet Order:  Diet regular Room service appropriate? Yes; Fluid consistency: Thin  Skin:  Reviewed, no issues  Last BM:  6/12  Height:   Ht Readings from Last 1 Encounters:  07/19/16 5\' 4"  (1.626 m)   Weight:   Wt Readings from Last 1 Encounters:  07/19/16 226 lb 6.4 oz (102.7 kg)   Ideal Body Weight:  54.5 kg  BMI:  Body mass index is 38.86 kg/m.  Estimated Nutritional Needs:   Kcal:  1700-1900  Protein:  80-90 gm  Fluid:  1.7-1.9 L  EDUCATION NEEDS:   No education needs identified at this time  Maureen Chatters, RD, LDN Pager #: [phone] After-Hours Pager #: [phone]

## 2016-07-21 ENCOUNTER — Inpatient Hospital Stay (HOSPITAL_COMMUNITY): Payer: Medicare Other

## 2016-07-21 ENCOUNTER — Telehealth: Payer: Self-pay

## 2016-07-21 DIAGNOSIS — I5030 Unspecified diastolic (congestive) heart failure: Secondary | ICD-10-CM

## 2016-07-21 LAB — GLUCOSE, CAPILLARY
GLUCOSE-CAPILLARY: 209 mg/dL — AB (ref 65–99)
GLUCOSE-CAPILLARY: 200 mg/dL — AB (ref 65–99)
GLUCOSE-CAPILLARY: 257 mg/dL — AB (ref 65–99)
Glucose-Capillary: 286 mg/dL — ABNORMAL HIGH (ref 65–99)

## 2016-07-21 LAB — HEMOGLOBIN A1C
Hgb A1c MFr Bld: 8.8 % — ABNORMAL HIGH (ref 4.8–5.6)
MEAN PLASMA GLUCOSE: 206 mg/dL

## 2016-07-21 LAB — ECHOCARDIOGRAM COMPLETE
Height: 64 in
WEIGHTICAEL: 3622.4 [oz_av]

## 2016-07-21 MED ORDER — CLOPIDOGREL BISULFATE 75 MG PO TABS: 75.0000 mg | ORAL_TABLET | Freq: Every day | ORAL | 1 refills | Status: AC

## 2016-07-21 MED ORDER — ASPIRIN 81 MG PO CHEW: 81.0000 mg | CHEWABLE_TABLET | Freq: Every day | ORAL | 1 refills | Status: AC

## 2016-07-21 MED ORDER — DULOXETINE HCL 60 MG PO CPEP: 60.0000 mg | ORAL_CAPSULE | Freq: Two times a day (BID) | ORAL | 1 refills | Status: AC

## 2016-07-21 MED ORDER — LISINOPRIL 5 MG PO TABS: 5.0000 mg | ORAL_TABLET | Freq: Every day | ORAL | 1 refills | Status: DC

## 2016-07-21 MED ORDER — ATORVASTATIN CALCIUM 40 MG PO TABS: 40.0000 mg | ORAL_TABLET | Freq: Every day | ORAL | 1 refills | Status: AC

## 2016-07-21 NOTE — Progress Notes (Signed)
  Echocardiogram 2D Echocardiogram has been performed.  Tonya Sanders 07/21/2016, 10:58 AM 

## 2016-07-21 NOTE — Progress Notes (Signed)
STROKE TEAM PROGRESS NOTE  SUBJECTIVE (INTERVAL HISTORY) Patient states she has not been taking her medications at home because "they didn't send them to me". Not compliant with ASA or Plavix. Patient does not know if she has seen Neurology in the past, very poor historian or doesn't want to tell me who she has seen in the past for her multiple remote strokes.   OBJECTIVE Temp:  [98.1 F (36.7 C)-99.1 F (37.3 C)] 98.1 F (36.7 C) (06/15 0848) Pulse Rate:  [72-88] 77 (06/15 0848) Cardiac Rhythm: Normal sinus rhythm (06/15 0700) Resp:  [16-18] 18 (06/15 0848) BP: (136-156)/(64-95) 136/76 (06/15 0848) SpO2:  [94 %-100 %] 100 % (06/15 0848)  CBC:   Recent Labs Lab 07/19/16 0659 07/20/16 0420  WBC 12.1* 8.1  HGB 13.5 13.2  HCT 39.3 39.8  MCV 94.5 94.5  PLT 279 311    Basic Metabolic Panel:   Recent Labs Lab 07/19/16 0659  NA 136  K 4.0  CL 104  CO2 22  GLUCOSE 240*  BUN 10  CREATININE 0.61  CALCIUM 8.8*    Lipid Panel:     Component Value Date/Time   CHOL 155 07/20/2016 0420   TRIG 250 (H) 07/20/2016 0420   HDL 28 (L) 07/20/2016 0420   CHOLHDL 5.5 07/20/2016 0420   VLDL 50 (H) 07/20/2016 0420   LDLCALC 77 07/20/2016 0420   HgbA1c:  Lab Results  Component Value Date   HGBA1C 8.8 (H) 07/20/2016   Urine Drug Screen: No results found for: LABOPIA, COCAINSCRNUR, LABBENZ, AMPHETMU, THCU, LABBARB  Alcohol Level No results found for: [facility]  IMAGING  Dg Chest 2 View 07/19/2016 Low lung volumes. Perhaps mild interstitial thickening/edema. No evidence of pneumonia or overt alveolar pulmonary edema.   Ct Head Wo Contrast 07/19/2016 1. Age-indeterminate small right cerebellar infarct. Provided history suggests recent infarct, suggest MR correlation. 2. Age-indeterminate right thalamus lacunar infarct. 3. Remote left PCA distribution infarct, new from 2008.   Mr Brain Wo Contrast 07/19/2016  1. Moderate acute infarct in the lateral right cerebellum. 2. Small remote  right cerebellar infarct adjacent to #1. 3. Remote left PCA territory infarct affecting the occipital lobe. Superimposed foci of diffusion hyperintensity are likely chronic/artifactual. 4. Symmetric mild subcortical and cortical gliosis along the frontoparietal convexities, possible old watershed infarcts.    PHYSICAL EXAM  PHYSICAL EXAM Physical exam: Exam: Gen: NAD, flat affect Eyes: anicteric sclerae, moist conjunctivae                    CV: no MRG, no carotid bruits, no peripheral edema Mental Status: lethargic, follows simple commands, poor historian  Neuro: Detailed Neurologic Exam  Speech:    Dysarthric but also adentulous  Cranial Nerves:    The pupils are equal, round, and reactive to light.. Attempted, Fundi not visualized.  EOMI. Nystagmus on right gaze. Left gaze preference.  Visual fields full. Face symmetric, Tongue midline. Hearing intact to voice. Shoulder shrug intact  Motor Observation:    no involuntary movements noted. Tone appears normal. Dysmetria right UE.    Strength:  poor effort throughout, 4/5 but appears symmetric     Sensation:  Intact to LT  Plantars equivocal.     ASSESSMENT/PLAN Ms. DECLYN OFFIELD is a 56 y.o. female with history of arthritis, GERD, HLD, HTN, neuropathy, seizures, DM, HLD not compliant with medication and prior stroke presenting with nausea and vomiting, HA and dizziness. She did not receive IV t-PA due to delay in arrival.  I'm sure patient has been worked up in the past or been inpatient somewhere for all her remote strokes but she says she can't remember. No notes in epic other than ED notes but appears she has been to [facility] and [facility] as well.Recommend review of previous notes and imaging. Her primary care doctor is [facility] in [place], spoke to him today: she is not compliant and has not followed up in a "very long time" [phone]. She was in [place]. She was in [facility] in 08/2014 11/2014  for acute stroke and evaluated there and admitted for stroke. Neurology saw her and this is when Plavix 75mg  was added to her ASA 325mg . Recommend reviewing [facility]. I Discussed the above with attending of the teaching service, Dr. Oswaldo Done. Patient should have TEE and loop and possibly outpatient hypercoag workup.    Stroke:   R cerebellar infarct and multiple previous infarcts concerning for embolic etiology.   CT head small R cerebellar infarct, old R thalamic lacune. Old L PCA infarct since 2008.  MRI head mod lateral R cerebellar infarct. Old R cerebellar infarct. Old L PCA infarct. Possible old watershed infarcts frontoparietal convexities  CTA head and neck unremarkable  2D Echo  No pfo, no thrombus  Patient should have TEE and loop inpatient. Hypercoag workup possibly outpatient.  LDL 77  HgbA1c 8.8  Lovenox 40 mg sq daily for VTE prophylaxis Diet regular Room service appropriate? Yes; Fluid consistency: Thin  Prescribed to take aspirin and plavix PTA, pt states she was not compliant with plavix or aspirin 81 mg because he pharmacy did not send them to her. She is now on aspirin 81 mg daily and clopidogrel 75 mg daily  Patient counseled to be compliant with her antithrombotic medications  Ongoing aggressive stroke risk factor management  Therapy recommendations:  OP OT  Disposition:  pending   Hypertension  Stable  Permissive hypertension (OK if < 220/120) but gradually normalize in 5-7 days  Long-term BP goal normotensive  Hyperlipidemia  Now on lipitor 40mg  daily  LDL 77, goal < 70  Continue statin at discharge  Diabetes type II  Glucoses elevated  HgbA1c 8.8, goal < 7.0  Uncontrolled  Other Stroke Risk Factors  Former Cigarette smoker  ETOH use, advised to drink no more than 1 drink(s) a day  Obesity, Body mass index is 38.86 kg/m., recommend weight loss, diet and exercise as appropriate   Hx stroke/TIA  Seen at [facility] per pt, do not have access to records - have requested to see if they have anything. Reports she has seen Eulis Manly MD in the past. Will contact him for info as well.  Other Active Problems  Seizures  GERD  Hospital day # 2  Personally examined patient and images, and have participated in and made any corrections needed to history, physical, neuro exam,assessment and plan as stated above.  I have personally obtained the history, evaluated lab date, reviewed imaging studies and agree with radiology interpretations.    Naomie Dean, MD Stroke Neurology  To contact Stroke Continuity provider, please refer to [email or web address]. After hours, contact General Neurology

## 2016-07-21 NOTE — Care Management Note (Signed)
Case Management Note  Patient Details  Name: Tonya Sanders MRN: [id number] Date of Birth: [birth year]  Subjective/Objective:                    Action/Plan: Plan is for patient to discharge home with outpatient therapy. CM met with the patient and she would like to attend [facility]. Orders in EPIC and information on the AVS.  Pt with orders for cane and shower chair. [name] with [facility] DME notified and will deliver the equipment to the room.   Expected Discharge Date:                  Expected Discharge Plan:  OP Rehab  In-House Referral:     Discharge planning Services  CM Consult  Post Acute Care Choice:  Durable Medical Equipment Choice offered to:  Patient  DME Arranged:  [name], Shower stool DME Agency:  [place]:    [facility] [place] Agency:     Status of Service:  Completed, signed off  If discussed at [place] of Stay Meetings, dates discussed:    Additional Comments:  [name], RN 07/21/2016, 4:24 PM

## 2016-07-21 NOTE — Telephone Encounter (Signed)
Hospital TOC per Dr Terrilee Croak, discharge 07/21/2016, appt 07/26/2016 @ 3:15.

## 2016-07-21 NOTE — Discharge Instructions (Signed)
Please take your medications as prescribed as this is very important to reduce the risk of future strokes. Please follow up with the [facility] on 6/20 to make sure you are doing well and that you are able to obtain all your medications. Follow up with your PCP for your diabetes and the Neurologists as scheduled.

## 2016-07-21 NOTE — Progress Notes (Signed)
   Subjective:  Patient is feeling better, denies any dizziness today. She worked well with therapy yesterday. No chest pain or SOB.  Objective:  Vital signs in last 24 hours: Vitals:   07/21/16 0107 07/21/16 0429 07/21/16 0500 07/21/16 0848  BP: (!) 147/64 (!) 145/95 (!) 141/75 136/76  Pulse: 80 84  77  Resp: 18 16  18   Temp: 98.4 F (36.9 C) 98.6 F (37 C)  98.1 F (36.7 C)  TempSrc: Oral Oral  Oral  SpO2: 96% 99%  100%  Weight:      Height:       General: resting in bed, NAD Cardiac: RRR, no rubs, murmurs or gallops Pulm: clear to auscultation bilaterally, moving normal volumes of air Ext: warm and well perfused, no pedal edema Neuro: alert and oriented X3, rightward nystagmus, subtle left sided downward beating nystagmus   Assessment/Plan:  Active Problems:   CVA (cerebral vascular accident) (HCC)   Cerebellar stroke (HCC)  # Acute right lateral cerebellar infarct ## Hx of CVA We are continuing medical management to reduce the risk for further strokes. CTA Head/Neck yesterday showed a filling defect suggestive of nonocclusive jugular venous thrombosis in the left mid-distal internal jugular vein. We will continue DAPT and high-intensity statin therapy. Will discuss with neurology on further recommendations. TTE shows an EF 55-60% with grade 1 diastolic dysfunction. -- Neurology recommendations appreciated -- Continue aspirin and Plavix -- Continue Atorvastatin 40 mg daily -- Diazepam 5 mg by mouth every 12 hours when necessary dizziness  # Hypertension ## Hypercholesterolemia  -- Lisinopril 5 mg daily -- Atorvastatin  # Diabetes  -- Sliding Scale Insulin + Lantus 15 units qhs -- Hg A1c 8.8  # Seizures -- Seizure precautions   Dispo: Anticipated discharge today.   Darreld Mclean, MD 07/21/2016, 12:38 PM

## 2016-07-21 NOTE — Progress Notes (Signed)
Dr. Oswaldo Done discussed patient's case with neurohospitalist, Dr. Lucia Gaskins. Recommendations are for TEE and ILR due to concern for embolic source. I discussed this with the patient and her husband who are understanding and acknowledge that workup will likely be completed on Tuesday 07/25/16. They have agreed to remain here until stroke workup is completed. Discussed case with Cardiology service who will arrange for TEE and contact EP for ILR placement.  Darreld Mclean, MD Internal Medicine PGY-2

## 2016-07-21 NOTE — Progress Notes (Signed)
Physical Therapy Treatment Patient Details Name: Tonya Sanders MRN: [id number] DOB: Dec 21, 1960 Today's Date: 07/21/2016    History of Present Illness Pt is a 56 y/o female with a history of stroke (with right visual field cut) who presents with an acute headache, spinning, and nausea. MRI revealed acute infarct in the lateral right cerebellum, Remote left PCA territory infarct affecting the occipital lobe.    PT Comments    Progressing well.  Pt worked on improving use/sequencing with cane used on the right side.  AND use of cane on the Left side to assist with stability.   Follow Up Recommendations  Outpatient PT;Supervision/Assistance - 24 hour     Equipment Recommendations  Other (comment)    Recommendations for Other Services       Precautions / Restrictions Precautions Precautions: Fall (risk due to pt's impulsiveness and moving too fast at times) Precaution Comments: R visual field cut    Mobility  Bed Mobility Overal bed mobility: Modified Independent             General bed mobility comments: (P) HOB elevated, use of rails to assist  Transfers Overall transfer level: Modified independent                  Ambulation/Gait Ambulation/Gait assistance: Supervision Ambulation Distance (Feet): 350 Feet Assistive device: Straight cane;None Gait Pattern/deviations: Step-through pattern Gait velocity: Decreased Gait velocity interpretation: at or above normal speed for age/gender General Gait Details: mild unsteadiness at time, but no overt, unrecoverable LOB with/without AD.  With cane, pt able to steady herself though she never fully uses the care correctly to get the optimal benefit from it.   Stairs Stairs: Yes   Stair Management: One rail Right;With cane;Step to pattern;Forwards Number of Stairs: 10    Wheelchair Mobility    Modified Rankin (Stroke Patients Only) Modified Rankin (Stroke Patients Only) Pre-Morbid Rankin Score: Slight  disability Modified Rankin: Moderate disability     Balance Overall balance assessment: Needs assistance   Sitting balance-Leahy Scale: Good     Standing balance support: Single extremity supported;No upper extremity supported Standing balance-Leahy Scale: Fair Standing balance comment: better stability with cane/AD, but not required statically                            Cognition Arousal/Alertness: Awake/alert Behavior During Therapy: WFL for tasks assessed/performed Overall Cognitive Status: History of cognitive impairments - at baseline Area of Impairment: Safety/judgement                         Safety/Judgement: Decreased awareness of safety;Decreased awareness of deficits            Exercises      General Comments General comments (skin integrity, edema, etc.): pt states she actually felt more steady wiht the cane.      Pertinent Vitals/Pain Pain Assessment: No/denies pain Faces Pain Scale: No hurt Pain Intervention(s): Monitored during session    Home Living                      Prior Function            PT Goals (current goals can now be found in the care plan section) Acute Rehab PT Goals Patient Stated Goal: independent at home PT Goal Formulation: With patient Time For Goal Achievement: 07/27/16 Potential to Achieve Goals: Good Progress towards PT goals: Progressing toward goals  Frequency    Min 4X/week      PT Plan Current plan remains appropriate    Co-evaluation              AM-PAC PT "6 Clicks" Daily Activity  Outcome Measure  Difficulty turning over in bed (including adjusting bedclothes, sheets and blankets)?: A Little Difficulty moving from lying on back to sitting on the side of the bed? : A Little Difficulty sitting down on and standing up from a chair with arms (e.g., wheelchair, bedside commode, etc,.)?: Total Help needed moving to and from a bed to chair (including a wheelchair)?: A  Little Help needed walking in hospital room?: A Little Help needed climbing 3-5 steps with a railing? : A Little 6 Click Score: 16    End of Session       Nurse Communication: Mobility status PT Visit Diagnosis: Unsteadiness on feet (R26.81);Other abnormalities of gait and mobility (R26.89)     Time: 1610-9604 PT Time Calculation (min) (ACUTE ONLY): 23 min  Charges:  $Gait Training: 8-22 mins $Therapeutic Activity: 8-22 mins                    G Codes:       28-Jul-2016  [place] Bing, PT [phone]  (pager)   Tonya Sanders Jul 28, 2016, 3:56 PM

## 2016-07-21 NOTE — Progress Notes (Signed)
Occupational Therapy Treatment Patient Details Name: PARMIS FARAG MRN: [id number] DOB: 03-06-1960 Today's Date: 07/21/2016    History of present illness Pt is a 56 y/o female with a history of stroke (with right visual field cut) who presents with an acute headache, spinning, and nausea. MRI revealed acute infarct in the lateral right cerebellum, Remote left PCA territory infarct affecting the occipital lobe.   OT comments  Pt making progress towards goals. Session focus was independence during bathing and education with Nystagmus and visual field deficit. Next session to focus on tub transfer as Pt has an old antique tub and will have to get in it safely. If her symptoms keep improving, therapy recommendations might need to be updated.  Follow Up Recommendations  Outpatient OT;Supervision - Intermittent    Equipment Recommendations  Tub/shower seat    Recommendations for Other Services      Precautions / Restrictions Precautions Precautions: Fall (risk due to pt's impulsiveness and moving too fast at times) Precaution Comments: R visual field cut Restrictions Weight Bearing Restrictions: No       Mobility Bed Mobility Overal bed mobility: Modified Independent             General bed mobility comments: HOB elevated, use of rails to assist  Transfers Overall transfer level: Modified independent Equipment used: Straight cane Transfers: Sit to/from Stand Sit to Stand: Modified independent (Device/Increase time)              Balance Overall balance assessment: Needs assistance Sitting-balance support: No upper extremity supported;Feet supported Sitting balance-Leahy Scale: Good Sitting balance - Comments: able to perform sponge bath at sink   Standing balance support: Single extremity supported;No upper extremity supported Standing balance-Leahy Scale: Fair Standing balance comment: better stability with cane/AD, but not required statically                            ADL either performed or assessed with clinical judgement   ADL Overall ADL's : Needs assistance/impaired     Grooming: Supervision/safety;Standing Grooming Details (indicate cue type and reason): sink level Upper Body Bathing: Modified independent;Sitting Upper Body Bathing Details (indicate cue type and reason): sponge bath in sink  Lower Body Bathing: Modified independent;Sit to/from stand Lower Body Bathing Details (indicate cue type and reason): sponge bathing at sink Upper Body Dressing : Set up;Sitting   Lower Body Dressing: Supervision/safety;Sit to/from stand Lower Body Dressing Details (indicate cue type and reason): don socks Toilet Transfer: Supervision/safety;Ambulation;Comfort height toilet;Grab bars   Toileting- Clothing Manipulation and Hygiene: Modified independent;Sit to/from stand Toileting - Clothing Manipulation Details (indicate cue type and reason): use sink to stabilize herself     Functional mobility during ADLs: Supervision/safety;Cane General ADL Comments: Nystagmus decreased in right eye today, bilateral eyes drift during conversation     Vision   Vision Assessment?: Yes Eye Alignment: Impaired (comment) (both eyes drift during conversation this session) Ocular Range of Motion: Within Functional Limits Alignment/Gaze Preference: Chin down;Head tilt Tracking/Visual Pursuits: Decreased smoothness of horizontal tracking;Impaired - to be further tested in functional context (able to complete functional grooming) Convergence: Within functional limits Visual Fields: Right visual field deficit Additional Comments: Nystagmus is still present in right eye   Perception     Praxis      Cognition Arousal/Alertness: Awake/alert Behavior During Therapy: WFL for tasks assessed/performed Overall Cognitive Status: History of cognitive impairments - at baseline Area of Impairment: Safety/judgement  Safety/Judgement: Decreased awareness of safety;Decreased awareness of deficits              Exercises     Shoulder Instructions       General Comments pt states she actually felt more steady wiht the cane.    Pertinent Vitals/ Pain       Pain Assessment: No/denies pain Faces Pain Scale: No hurt Pain Intervention(s): Monitored during session  Home Living                                          Prior Functioning/Environment              Frequency  Min 2X/week        Progress Toward Goals  OT Goals(current goals can now be found in the care plan section)  Progress towards OT goals: Progressing toward goals  Acute Rehab OT Goals Patient Stated Goal: independent at home OT Goal Formulation: With patient Time For Goal Achievement: 08/03/16 Potential to Achieve Goals: Good  Plan Discharge plan remains appropriate;Frequency remains appropriate    Co-evaluation                 AM-PAC PT "6 Clicks" Daily Activity     Outcome Measure   Help from another person eating meals?: None Help from another person taking care of personal grooming?: A Little Help from another person toileting, which includes using toliet, bedpan, or urinal?: None Help from another person bathing (including washing, rinsing, drying)?: A Little Help from another person to put on and taking off regular upper body clothing?: None Help from another person to put on and taking off regular lower body clothing?: A Little 6 Click Score: 21    End of Session Equipment Utilized During Treatment:  (SPC)  OT Visit Diagnosis: Unsteadiness on feet (R26.81);Other symptoms and signs involving the nervous system (R29.898)   Activity Tolerance Patient tolerated treatment well   Patient Left in bed;with call bell/phone within reach;with bed alarm set   Nurse Communication Mobility status (sponge bath and shower cap complete)        Time: 1610-9604 OT Time Calculation  (min): 23 min  Charges: OT General Charges $OT Visit: 1 Procedure OT Treatments $Self Care/Home Management : 23-37 mins  Sherryl Manges OTR/L [phone]  Evern Bio Jaylenne Hamelin 07/21/2016, 4:05 PM

## 2016-07-21 NOTE — Progress Notes (Signed)
    [facility] has been requested to perform a transesophageal echocardiogram on Tonya Sanders for cerebrovascular accident.  After careful review of history and examination, the risks and benefits of transesophageal echocardiogram have been explained including risks of esophageal damage, perforation (1:10,000 risk), bleeding, pharyngeal hematoma as well as other potential complications associated with conscious sedation including aspiration, arrhythmia, respiratory failure and death. Alternatives to treatment were discussed, questions were answered. Patient is willing to proceed.   This is tentatively scheduled for 07/26/2016 at 1000 with Dr. Rennis Golden. The patient was very agitated that this would be so late in the week. Informed her if there are cancellations, we will move her date up, but otherwise this is the first available slot.  Signed, Ellsworth Lennox, PA-C 07/21/2016, 4:32 PM Pager: [phone]

## 2016-07-21 NOTE — Progress Notes (Signed)
Subjective: Patient reports feeling much better this morning.  She was sitting up on the bed when I walked in.  She says she has a minor headache occasionally but reports no dizziness or nausea.  She does report some indigestion.   Objective: Vital signs in last 24 hours: Vitals:   07/21/16 0107 07/21/16 0429 07/21/16 0500 07/21/16 0848  BP: (!) 147/64 (!) 145/95 (!) 141/75 136/76  Pulse: 80 84  77  Resp: 18 16  18   Temp: 98.4 F (36.9 C) 98.6 F (37 C)  98.1 F (36.7 C)  TempSrc: Oral Oral  Oral  SpO2: 96% 99%  100%  Weight:      Height:        Intake/Output Summary (Last 24 hours) at 07/21/16 1151 Last data filed at 07/21/16 0529  Gross per 24 hour  Intake               10 ml  Output                0 ml  Net               10 ml    Physical Exam: GEN: well appearing, tired, in no acute distress HEENT: PERRL CV: regular rate and rhythm RESP: lungs CTAB ABD: soft, normal bowel sounds NEURO: nystagmus to the right when looking to the right, no noticeable nystagmus when looking to the left.  Lab Results: CBC Latest Ref Rng & Units 07/20/2016 07/19/2016  WBC 4.0 - 10.5 K/uL 8.1 12.1(H)  Hemoglobin 12.0 - 15.0 g/dL 16.1 09.6  Hematocrit 04.5 - 46.0 % 39.8 39.3  Platelets 150 - 400 K/uL 311 279    BMP Latest Ref Rng & Units 07/19/2016  Glucose 65 - 99 mg/dL 409(W)  BUN 6 - 20 mg/dL 10  Creatinine 1.19 - 1.47 mg/dL 8.29  Sodium 562 - 130 mmol/L 136  Potassium 3.5 - 5.1 mmol/L 4.0  Chloride 101 - 111 mmol/L 104  CO2 22 - 32 mmol/L 22  Calcium 8.9 - 10.3 mg/dL 8.6(V)    Studies/Results:  Echo 6/15 - Left ventricle: The cavity size was normal. Wall thickness was   increased in a pattern of mild LVH. Systolic function was normal.   The estimated ejection fraction was in the range of 55% to 60%.   Wall motion was normal; there were no regional wall motion   abnormalities. Doppler parameters are consistent with abnormal   left ventricular relaxation (grade 1 diastolic  dysfunction). - Left atrium: The atrium was mildly dilated. - Atrial septum: No defect or patent foramen ovale was identified.    Ct Angio Head W Or Wo Contrast  Result Date: 07/20/2016 CLINICAL DATA:  Follow-up examination for acute stroke, confirmed on prior MRI. EXAM: CT ANGIOGRAPHY HEAD AND NECK TECHNIQUE: Multidetector CT imaging of the head and neck was performed using the standard protocol during bolus administration of intravenous contrast. Multiplanar CT image reconstructions and MIPs were obtained to evaluate the vascular anatomy. Carotid stenosis measurements (when applicable) are obtained utilizing NASCET criteria, using the distal internal carotid diameter as the denominator. CONTRAST:  50 cc of Isovue 370. COMPARISON:  Previous MRI and CT from 07/19/2016. FINDINGS: CT HEAD FINDINGS Brain: Evolving acute ischemic infarct involving the lateral right cerebellar hemisphere again seen, stable in distribution relative to previous MRI. No significant mass effect. No evidence for hemorrhagic transformation. Adjacent chronic right cerebellar infarct again noted. Additional chronic left PCA territory infarct present as well. No acute intracranial  hemorrhage. No other new acute intracranial infarct. No mass lesion or midline shift. No hydrocephalus. No extra-axial fluid collection. Vascular: No hyperdense vessel. Skull: Scalp and calvarium within normal limits. Sinuses: Paranasal sinuses and mastoid air cells are clear. Orbits: Globes and orbital soft tissues within normal limits. Review of the MIP images confirms the above findings CTA NECK FINDINGS Aortic arch: Visualized aortic arch of normal caliber and appearance. Aberrant right subclavian artery noted. No high-grade stenosis about the origin of the great vessels. Visualized subclavian arteries widely patent. Right carotid system: Right common and internal carotid artery's widely patent without stenosis, dissection, or occlusion. No significant  atheromatous narrowing about the right carotid bifurcation. Left carotid system: Left common and internal carotid artery's widely patent without stenosis, dissection, or occlusion. No significant atheromatous narrowing about the left carotid bifurcation. Vertebral arteries: Both of the vertebral arteries arise from the subclavian arteries. Vertebral arteries patent within the neck without stenosis, dissection, or occlusion. Skeleton: No acute osseous abnormality. No worrisome lytic or blastic osseous lesions. Moderate degenerative spondylolysis present at C6-7. Other neck: Filling defect within the mid-distal left internal jugular vein, compatible with nonocclusive thrombus (series 11, image 250). No other acute soft tissue abnormality within the neck. Salivary glands normal. No adenopathy. Thyroid normal. Upper chest: Visualized upper chest within normal limits. Review of the MIP images confirms the above findings CTA HEAD FINDINGS Anterior circulation: Petrous segments widely patent bilaterally. Mild scattered atheromatous plaque within the carotid siphons without flow limiting stenosis. ICA termini widely patent. A1 segments patent. Anterior communicating artery normal. Anterior cerebral arteries widely patent to their distal aspects. M1 segments patent without stenosis or occlusion. No proximal M2 occlusion. Distal MCA branches well opacified and symmetric. Posterior circulation: Vertebral artery is widely patent to the vertebrobasilar junction. Posterior inferior cerebral arteries not well evaluated on this exam. Basilar artery widely patent to its distal aspect. Superior cerebral arteries patent bilaterally. Both of the posterior cerebral arteries supplied primarily via the basilar artery. Right PCA widely patent to its distal aspect. Chronic occlusion of the proximal left P 2 segment given the large remote left PCA territory infarct. Venous sinuses: Somewhat elongated irregular filling defect within the  distal left transverse sinus, suspicious for possible small amount of nonocclusive thrombus (series 13, image 158). Venous sinuses are otherwise widely patent. Anatomic variants: No significant anatomic variant. No aneurysm or vascular malformation. Delayed phase: No pathologic enhancement. Review of the MIP images confirms the above findings IMPRESSION: CTA NECK IMPRESSION: 1. Negative CTA of the neck. No high-grade or critical stenosis within the major arterial vasculature of the neck. 2. Aberrant right subclavian artery. 3. Filling defect within the mid-distal left internal jugular vein, compatible with nonocclusive jugular venous thrombosis. CTA HEAD IMPRESSION: 1. Chronic proximal left P2 occlusion. 2. Otherwise negative CTA for large or proximal arterial branch occlusion. No other high-grade or correctable stenosis. 3. Small focal irregular filling defect within the distal left transverse sinus, suspicious for small amount of nonocclusive thrombus given the findings in the neck. Electronically Signed   By: Rise Mu M.D.   On: 07/20/2016 22:19   Ct Angio Neck W Or Wo Contrast  Result Date: 07/20/2016 CLINICAL DATA:  Follow-up examination for acute stroke, confirmed on prior MRI. EXAM: CT ANGIOGRAPHY HEAD AND NECK TECHNIQUE: Multidetector CT imaging of the head and neck was performed using the standard protocol during bolus administration of intravenous contrast. Multiplanar CT image reconstructions and MIPs were obtained to evaluate the vascular anatomy. Carotid stenosis measurements (when  applicable) are obtained utilizing NASCET criteria, using the distal internal carotid diameter as the denominator. CONTRAST:  50 cc of Isovue 370. COMPARISON:  Previous MRI and CT from 07/19/2016. FINDINGS: CT HEAD FINDINGS Brain: Evolving acute ischemic infarct involving the lateral right cerebellar hemisphere again seen, stable in distribution relative to previous MRI. No significant mass effect. No evidence  for hemorrhagic transformation. Adjacent chronic right cerebellar infarct again noted. Additional chronic left PCA territory infarct present as well. No acute intracranial hemorrhage. No other new acute intracranial infarct. No mass lesion or midline shift. No hydrocephalus. No extra-axial fluid collection. Vascular: No hyperdense vessel. Skull: Scalp and calvarium within normal limits. Sinuses: Paranasal sinuses and mastoid air cells are clear. Orbits: Globes and orbital soft tissues within normal limits. Review of the MIP images confirms the above findings CTA NECK FINDINGS Aortic arch: Visualized aortic arch of normal caliber and appearance. Aberrant right subclavian artery noted. No high-grade stenosis about the origin of the great vessels. Visualized subclavian arteries widely patent. Right carotid system: Right common and internal carotid artery's widely patent without stenosis, dissection, or occlusion. No significant atheromatous narrowing about the right carotid bifurcation. Left carotid system: Left common and internal carotid artery's widely patent without stenosis, dissection, or occlusion. No significant atheromatous narrowing about the left carotid bifurcation. Vertebral arteries: Both of the vertebral arteries arise from the subclavian arteries. Vertebral arteries patent within the neck without stenosis, dissection, or occlusion. Skeleton: No acute osseous abnormality. No worrisome lytic or blastic osseous lesions. Moderate degenerative spondylolysis present at C6-7. Other neck: Filling defect within the mid-distal left internal jugular vein, compatible with nonocclusive thrombus (series 11, image 250). No other acute soft tissue abnormality within the neck. Salivary glands normal. No adenopathy. Thyroid normal. Upper chest: Visualized upper chest within normal limits. Review of the MIP images confirms the above findings CTA HEAD FINDINGS Anterior circulation: Petrous segments widely patent  bilaterally. Mild scattered atheromatous plaque within the carotid siphons without flow limiting stenosis. ICA termini widely patent. A1 segments patent. Anterior communicating artery normal. Anterior cerebral arteries widely patent to their distal aspects. M1 segments patent without stenosis or occlusion. No proximal M2 occlusion. Distal MCA branches well opacified and symmetric. Posterior circulation: Vertebral artery is widely patent to the vertebrobasilar junction. Posterior inferior cerebral arteries not well evaluated on this exam. Basilar artery widely patent to its distal aspect. Superior cerebral arteries patent bilaterally. Both of the posterior cerebral arteries supplied primarily via the basilar artery. Right PCA widely patent to its distal aspect. Chronic occlusion of the proximal left P 2 segment given the large remote left PCA territory infarct. Venous sinuses: Somewhat elongated irregular filling defect within the distal left transverse sinus, suspicious for possible small amount of nonocclusive thrombus (series 13, image 158). Venous sinuses are otherwise widely patent. Anatomic variants: No significant anatomic variant. No aneurysm or vascular malformation. Delayed phase: No pathologic enhancement. Review of the MIP images confirms the above findings IMPRESSION: CTA NECK IMPRESSION: 1. Negative CTA of the neck. No high-grade or critical stenosis within the major arterial vasculature of the neck. 2. Aberrant right subclavian artery. 3. Filling defect within the mid-distal left internal jugular vein, compatible with nonocclusive jugular venous thrombosis. CTA HEAD IMPRESSION: 1. Chronic proximal left P2 occlusion. 2. Otherwise negative CTA for large or proximal arterial branch occlusion. No other high-grade or correctable stenosis. 3. Small focal irregular filling defect within the distal left transverse sinus, suspicious for small amount of nonocclusive thrombus given the findings in the neck.  Electronically Signed   By: Rise Mu M.D.   On: 07/20/2016 22:19   Mr Brain Wo Contrast  Result Date: 07/19/2016 CLINICAL DATA:  Question of new right cerebral infarct. EXAM: MRI HEAD WITHOUT CONTRAST TECHNIQUE: Multiplanar, multiecho pulse sequences of the brain and surrounding structures were obtained without intravenous contrast. COMPARISON:  Head CT from earlier today. FINDINGS: Brain: Moderate volume of restricted diffusion in the lateral right cerebellum, adjacent to a smaller area of remote infarct that was seen by CT. There are 2 foci of diffusion hyperintensity within the remote left PCA territory infarct affecting the occipital lobe. These are in an area of dense encephalomalacia and are likely chronic/artifactual (there is mineralization based on CT and T1 weighted imaging). No acute hemorrhage. Subcortical gliotic signal along the high frontal parietal convexities, with mild cortex involvement in the posterior frontal lobes. Remote right thalamus lacunar infarct. Normal brain volume. Vascular: Major flow voids are preserved. Skull and upper cervical spine: Negative Sinuses/Orbits: Negative IMPRESSION: 1. Moderate acute infarct in the lateral right cerebellum. 2. Small remote right cerebellar infarct adjacent to #1. 3. Remote left PCA territory infarct affecting the occipital lobe. Superimposed foci of diffusion hyperintensity are likely chronic/artifactual. 4. Symmetric mild subcortical and cortical gliosis along the frontoparietal convexities, possible old watershed infarcts. Electronically Signed   By: Marnee Spring M.D.   On: 07/19/2016 12:38    Medications:  Scheduled Meds: . aspirin  81 mg Oral Daily  . atorvastatin  40 mg Oral q1800  . clopidogrel  75 mg Oral Daily  . DULoxetine  60 mg Oral BID  . enoxaparin (LOVENOX) injection  40 mg Subcutaneous Q24H  . feeding supplement (GLUCERNA SHAKE)  237 mL Oral TID BM  . insulin aspart  0-15 Units Subcutaneous TID WC  .  insulin glargine  15 Units Subcutaneous QHS  . lisinopril  5 mg Oral Daily   Continuous Infusions: PRN Meds:.acetaminophen **OR** acetaminophen (TYLENOL) oral liquid 160 mg/5 mL **OR** acetaminophen, diazepam, senna-docusate, sodium chloride flush   Assessment/Plan: Active Problems:   CVA (cerebral vascular accident) (HCC)   Cerebellar stroke [facility])  Tonya Sanders is a 56 yo woman with a history of stroke, DM, HTN, and HLD who is improving after a right cerebellar stroke 2 days ago.  Acute Right Lateral Cerebellar Infarct: The patient is clinically improving after stroke.  She reports having no more dizziness or nausea.  PT recommends outpatient PT. - f/u outpatient in [facility], optimize DM regimine - continue ASA, clopidogrel - continue atorvastatin 40mg  - outpatient PT    DVT/PE prophylaxis:Lovenox FEN/GI:Regular diet Code:Full  Dispo: Anticipated discharge today.   This is a Psychologist, occupational Note.  The care of the patient was discussed with Dr. Allena Katz and the assessment and plan formulated with their assistance.  Please see their attached note for official documentation of the daily encounter.   LOS: 2 days   Mikal Plane, Medical Student 07/21/2016, 8:49 AM

## 2016-07-22 LAB — GLUCOSE, CAPILLARY
GLUCOSE-CAPILLARY: 195 mg/dL — AB (ref 65–99)
GLUCOSE-CAPILLARY: 284 mg/dL — AB (ref 65–99)
Glucose-Capillary: 291 mg/dL — ABNORMAL HIGH (ref 65–99)
Glucose-Capillary: 251 mg/dL — ABNORMAL HIGH (ref 65–99)

## 2016-07-22 MED ORDER — INSULIN GLARGINE 100 UNIT/ML ~~LOC~~ SOLN
18.0000 [IU] | Freq: Every day | SUBCUTANEOUS | Status: DC
  Administered 2016-07-23 (×2): 18 [IU] via SUBCUTANEOUS
  Filled 2016-07-22 (×2): qty 0.18

## 2016-07-22 NOTE — Progress Notes (Signed)
STROKE TEAM PROGRESS NOTE  SUBJECTIVE (INTERVAL HISTORY) Patient states she has not been taking her medications at home because "they didn't send them to me". Not compliant with ASA or Plavix. Patient does not know if she has seen Neurology in the past, very poor historian or doesn't want to tell me who she has seen in the past for her multiple remote strokes. The patient reports that she still has double vision.   OBJECTIVE Temp:  [98 F (36.7 C)-98.8 F (37.1 C)] 98.6 F (37 C) (06/16 1230) Pulse Rate:  [73-88] 88 (06/16 1230) Cardiac Rhythm: Normal sinus rhythm (06/16 0700) Resp:  [18-20] 18 (06/16 1230) BP: (136-146)/(66-76) 146/66 (06/16 1230) SpO2:  [96 %-98 %] 97 % (06/16 1230)  CBC:   Recent Labs Lab 07/19/16 0659 07/20/16 0420  WBC 12.1* 8.1  HGB 13.5 13.2  HCT 39.3 39.8  MCV 94.5 94.5  PLT 279 311    Basic Metabolic Panel:   Recent Labs Lab 07/19/16 0659  NA 136  K 4.0  CL 104  CO2 22  GLUCOSE 240*  BUN 10  CREATININE 0.61  CALCIUM 8.8*    Lipid Panel:     Component Value Date/Time   CHOL 155 07/20/2016 0420   TRIG 250 (H) 07/20/2016 0420   HDL 28 (L) 07/20/2016 0420   CHOLHDL 5.5 07/20/2016 0420   VLDL 50 (H) 07/20/2016 0420   LDLCALC 77 07/20/2016 0420   HgbA1c:  Lab Results  Component Value Date   HGBA1C 8.8 (H) 07/20/2016   Urine Drug Screen: No results found for: LABOPIA, COCAINSCRNUR, LABBENZ, AMPHETMU, THCU, LABBARB  Alcohol Level No results found for: [facility]  IMAGING  Dg Chest 2 View 07/19/2016 Low lung volumes. Perhaps mild interstitial thickening/edema. No evidence of pneumonia or overt alveolar pulmonary edema.   Ct Head Wo Contrast 07/19/2016 1. Age-indeterminate small right cerebellar infarct. Provided history suggests recent infarct, suggest MR correlation.  2. Age-indeterminate right thalamus lacunar infarct.  3. Remote left PCA distribution infarct, new from 2008.    Mr Brain Wo Contrast 07/19/2016  1. Moderate acute  infarct in the lateral right cerebellum.  2. Small remote right cerebellar infarct adjacent to #1.  3. Remote left PCA territory infarct affecting the occipital lobe. Superimposed foci of diffusion hyperintensity are likely chronic/artifactual.  4. Symmetric mild subcortical and cortical gliosis along the frontoparietal convexities, possible old watershed infarcts.    Ct Angio Head W Or Wo Contrast Ct Angio Neck W Or Wo Contrast 07/20/2016  CTA NECK  1. Negative CTA of the neck. No high-grade or critical stenosis within the major arterial vasculature of the neck.  2. Aberrant right subclavian artery.  3. Filling defect within the mid-distal left internal jugular vein, compatible with nonocclusive jugular venous thrombosis.  CTA HEAD 1. Chronic proximal left P2 occlusion.  2. Otherwise negative CTA for large or proximal arterial branch occlusion. No other high-grade or correctable stenosis.  3. Small focal irregular filling defect within the distal left transverse sinus, suspicious for small amount of nonocclusive thrombus given the findings in the neck.     PHYSICAL EXAM  GENERAL: She appears to be in some discomfort from double vision.  HEENT: Normal  ABDOMEN: soft  EXTREMITIES: No edema   SKIN: Normal by inspection.    MENTAL STATUS: Alert and oriented. Speech, language and cognition are generally intact. Judgment and insight normal.   CRANIAL NERVES: Pupils are equal, round and reactive to light and accomodation; extra ocular movements are full but  there is significant right beating nystagmus on movements of the right; visual fields are full; upper and lower facial muscles are normal in strength and symmetric, there is no flattening of the nasolabial folds.  MOTOR: Normal tone, bulk and strength; no pronator drift.  COORDINATION: Left finger to nose is normal, right finger to nose is normal, No rest tremor; no intention tremor; no postural tremor; no  bradykinesia.      ASSESSMENT/PLAN Ms. CIERRA ROTHGEB is a 56 y.o. female with history of arthritis, GERD, HLD, HTN, neuropathy, seizures, DM, HLD not compliant with medication and prior stroke presenting with nausea and vomiting, HA and dizziness. She did not receive IV t-PA due to delay in arrival.   I'm sure patient has been worked up in the past or been inpatient somewhere for all her remote strokes but she says she can't remember. No notes in epic other than ED notes but appears she has been to [facility] and [facility] as well.Recommend review of previous notes and imaging. Her primary care doctor is [facility] in [place], spoke to him today: she is not compliant and has not followed up in a "very long time" [phone]. She was in [place]. She was in [facility] in 08/2014 11/2014 for acute stroke and evaluated there and admitted for stroke. Neurology saw her and this is when Plavix 75mg  was added to her ASA 325mg .  Recommend reviewing records from [facility].  I discussed the above with attending of the teaching service, Dr. Oswaldo Done.  Patient should have TEE and loop and possibly outpatient hypercoag workup.    Stroke:   R cerebellar infarct and multiple previous infarcts concerning for embolic etiology.   CT head small R cerebellar infarct, old R thalamic lacune. Old L PCA infarct since 2008.  MRI head mod lateral R cerebellar infarct. Old R cerebellar infarct. Old L PCA infarct. Possible old watershed infarcts frontoparietal convexities  CTA head and neck unremarkable  2D Echo  No pfo, no thrombus  Patient should have TEE and loop inpatient. Hypercoag workup possibly outpatient.  LDL 77  HgbA1c 8.8  Lovenox 40 mg sq daily for VTE prophylaxis Diet regular Room service appropriate? Yes; Fluid consistency: Thin  Prescribed to take aspirin and plavix PTA, pt states she was not compliant with plavix or aspirin 81 mg because he pharmacy did not send them  to her. She is now on aspirin 81 mg daily and clopidogrel 75 mg daily  Patient counseled to be compliant with her antithrombotic medications  Ongoing aggressive stroke risk factor management  Therapy recommendations:  OP OT  Disposition:  pending   Hypertension  Stable  Permissive hypertension (OK if < 220/120) but gradually normalize in 5-7 days  Long-term BP goal normotensive  Hyperlipidemia  Now on lipitor 40mg  daily  LDL 77, goal < 70  Continue statin at discharge  Diabetes type II  Glucoses elevated  HgbA1c 8.8, goal < 7.0  Uncontrolled  Other Stroke Risk Factors  Former Cigarette smoker  ETOH use, advised to drink no more than 1 drink(s) a day  Obesity, Body mass index is 38.86 kg/m., recommend weight loss, diet and exercise as appropriate   Hx stroke/TIA  Seen at [facility] per pt, do not have access to records - have requested to see if they have anything. Reports she has seen Eulis Manly MD in the past. Will contact him for info as well.  Other Active Problems  Seizures  GERD  Hospital  day # 3  Personally examined patient and images, and have participated in and made any corrections needed to history, physical, neuro exam,assessment and plan as stated above.  I have personally obtained the history, evaluated lab date, reviewed imaging studies and agree with radiology interpretations.      To contact Stroke Continuity provider, please refer to [email or web address]. After hours, contact General Neurology

## 2016-07-22 NOTE — Progress Notes (Signed)
   Subjective:  Patient is feeling better, denies any dizziness today. She says she has been walking down the hall without any issues. Awaiting TEE and ILR.  Objective:  Vital signs in last 24 hours: Vitals:   07/21/16 1400 07/21/16 2100 07/22/16 0110 07/22/16 0652  BP: (!) 111/91 137/67 (!) 141/76 (!) 146/76  Pulse: 97 79 73 83  Resp: 18 18 18 18   Temp: 98.8 F (37.1 C) 98 F (36.7 C) 98.6 F (37 C) 98.8 F (37.1 C)  TempSrc: Oral Oral Oral Oral  SpO2: 97% 97% 96% 98%  Weight:      Height:       General: resting in bed, NAD Cardiac: RRR, no rubs, murmurs or gallops Pulm: clear to auscultation bilaterally, moving normal volumes of air Ext: warm and well perfused, no pedal edema Neuro: alert and oriented X3, rightward nystagmus, subtle left sided downward beating nystagmus   Assessment/Plan:  Active Problems:   CVA (cerebral vascular accident) (HCC)   Cerebellar stroke (HCC)  # Acute right lateral cerebellar infarct ## Hx of CVA We are continuing medical management to reduce the risk for further strokes. CTA Head/Neck showed a filling defect suggestive of nonocclusive jugular venous thrombosis in the left mid-distal internal jugular vein. We will continue DAPT and high-intensity statin therapy. Given her prior strokes and CTA findings, there is concern for embolic source of her strokes. We are awaiting TEE and ILR placement to complete the workup. -- Neurology recommendations appreciated -- Continue aspirin and Plavix -- Continue Atorvastatin 40 mg daily -- Diazepam 5 mg by mouth every 12 hours when necessary dizziness -- TEE planned for 6/20; EP to see for ILR placement  # Hypertension ## Hypercholesterolemia  -- Lisinopril 5 mg daily -- Atorvastatin  # Diabetes  -- Sliding Scale Insulin + Lantus 18 units qhs -- Hg A1c 8.8  # Seizures -- Seizure precautions   Dispo: Anticipated discharge pending TEE and ILR placement.   Darreld Mclean, MD 07/22/2016, 9:02  AM

## 2016-07-23 LAB — GLUCOSE, CAPILLARY
Glucose-Capillary: 177 mg/dL — ABNORMAL HIGH (ref 65–99)
Glucose-Capillary: 256 mg/dL — ABNORMAL HIGH (ref 65–99)
Glucose-Capillary: 228 mg/dL — ABNORMAL HIGH (ref 65–99)
Glucose-Capillary: 295 mg/dL — ABNORMAL HIGH (ref 65–99)

## 2016-07-23 MED ORDER — STROKE: EARLY STAGES OF RECOVERY BOOK: Freq: Once | Status: DC

## 2016-07-23 MED ORDER — RAMELTEON 8 MG PO TABS
8.0000 mg | ORAL_TABLET | Freq: Every evening | ORAL | Status: DC | PRN
  Administered 2016-07-23: 8 mg via ORAL
  Filled 2016-07-23 (×2): qty 1

## 2016-07-23 NOTE — Progress Notes (Signed)
Subjective: Patient was laying in bed talking on the phone this morning.  Denies any issues currently, waiting for TEE this week. Objective: Vital signs in last 24 hours: Vitals:   07/22/16 2143 07/23/16 0055 07/23/16 0551 07/23/16 0730  BP: 129/64 124/60 131/88 134/77  Pulse: 78 78 78 77  Resp: 20 20 20 20   Temp: 98.2 F (36.8 C) 98.1 F (36.7 C) 97.8 F (36.6 C) 98.3 F (36.8 C)  TempSrc: Oral Oral Oral Oral  SpO2: 98% 100% 100% 100%  Weight:      Height:        Intake/Output Summary (Last 24 hours) at 07/23/16 1012 Last data filed at 07/23/16 0857  Gross per 24 hour  Intake              960 ml  Output                0 ml  Net              960 ml    Lab Results: CBC Latest Ref Rng & Units 07/20/2016 07/19/2016  WBC 4.0 - 10.5 K/uL 8.1 12.1(H)  Hemoglobin 12.0 - 15.0 g/dL 54.9 82.6  Hematocrit 41.5 - 46.0 % 39.8 39.3  Platelets 150 - 400 K/uL 311 279    BMP Latest Ref Rng & Units 07/19/2016  Glucose 65 - 99 mg/dL 830(N)  BUN 6 - 20 mg/dL 10  Creatinine 4.07 - 6.80 mg/dL 8.81  Sodium 103 - 159 mmol/L 136  Potassium 3.5 - 5.1 mmol/L 4.0  Chloride 101 - 111 mmol/L 104  CO2 22 - 32 mmol/L 22  Calcium 8.9 - 10.3 mg/dL 4.5(O)    Studies/Results:  Echo 6/15 - Left ventricle: The cavity size was normal. Wall thickness was   increased in a pattern of mild LVH. Systolic function was normal.   The estimated ejection fraction was in the range of 55% to 60%.   Wall motion was normal; there were no regional wall motion   abnormalities. Doppler parameters are consistent with abnormal   left ventricular relaxation (grade 1 diastolic dysfunction). - Left atrium: The atrium was mildly dilated. - Atrial septum: No defect or patent foramen ovale was identified.    No results found.  Medications:  Scheduled Meds: .  stroke: mapping our early stages of recovery book   Does not apply Once  . aspirin  81 mg Oral Daily  . atorvastatin  40 mg Oral q1800  . clopidogrel  75 mg  Oral Daily  . DULoxetine  60 mg Oral BID  . enoxaparin (LOVENOX) injection  40 mg Subcutaneous Q24H  . feeding supplement (GLUCERNA SHAKE)  237 mL Oral TID BM  . insulin aspart  0-15 Units Subcutaneous TID WC  . insulin glargine  18 Units Subcutaneous QHS  . lisinopril  5 mg Oral Daily   Continuous Infusions: PRN Meds:.acetaminophen **OR** acetaminophen (TYLENOL) oral liquid 160 mg/5 mL **OR** acetaminophen, diazepam, senna-docusate, sodium chloride flush   Assessment/Plan: Active Problems:   CVA (cerebral vascular accident) (HCC)   Cerebellar stroke [facility])  Ms. Ambrose Pancoast is a 56 yo woman with a history of stroke, DM, HTN, and HLD who is improving after a right cerebellar stroke 2 days ago.  Acute Right Lateral Cerebellar Infarct: The patient is clinically improving after stroke.  She reports having no problems currently.  Awaiting TEE and ILR placement per stroke team consult. - TEE 6/20, ILR per EP - f/u outpatient in [facility], optimize DM regimine -  continue ASA, clopidogrel - continue atorvastatin 40mg  - outpatient PT    DVT/PE prophylaxis:Lovenox FEN/GI:Regular diet Code:Full  Dispo: Anticipated discharge 6/20.   This is a Psychologist, occupational Note.  The care of the patient was discussed with Dr. Allena Katz and the assessment and plan formulated with their assistance.  Please see their attached note for official documentation of the daily encounter.   LOS: 4 days   Mikal Plane, Medical Student 07/23/2016, 10:12 AM

## 2016-07-23 NOTE — Progress Notes (Signed)
   Subjective: Patient seen and evaluated at bedside. Noted difficulty sleeping last night and is subsequently tired this morning. Still with double vision. Awaiting TEE and ILR.  Objective:  Vital signs in last 24 hours: Vitals:   07/22/16 1856 07/22/16 2143 07/23/16 0055 07/23/16 0551  BP: 136/66 129/64 124/60 131/88  Pulse: 86 78 78 78  Resp: 20 20 20 20   Temp: 99.1 F (37.3 C) 98.2 F (36.8 C) 98.1 F (36.7 C) 97.8 F (36.6 C)  TempSrc: Oral Oral Oral Oral  SpO2: 97% 98% 100% 100%  Weight:      Height:       General: resting in bed, NAD. Fatigued. Cardiac: RRR, no rubs, murmurs or gallops Pulm: CTA BL with normal WOB on RA.  Ext: warm and well perfused. No pedal edema appreciated. No erythema.  Neuro: alert and oriented X3. Still with right-ward nystagmus.   Assessment/Plan:  Active Problems:   CVA (cerebral vascular accident) (HCC)   Cerebellar stroke (HCC)  #Acute right lateral cerebellar infarct ##Hx of CVA ###Non-occlusive jugular venous thrombosis, left mid-distal IJV Currently in process of optimizing medical management to reduce further stroke risk. Patient notes she was non-compliant with DAPT at home which has been resumed.  -Neurology on board, appreciate recs. Continuing embolic work-up given hx of multiple CVAs. Wont be seeing patient until TEE has been performed.  -Still awaiting outside records -TEE planned 6/20. EP to see for ILR placement. -ASA + Plavix -Atorvastatin 40mg  -Diazepam 5mg  Q12H prn dizziness -Have added melatonin QHS PRN sleep  #Hypertension ##Hypercholesterolemia  -Lisinopril 5 mg daily, BP well controlled -Atorvastatin  #Diabetes  -Hg A1c 8.8 -Sliding Scale Insulin + Lantus 18 units qhs  #Seizures -Seizure precautions   Dispo: Anticipated discharge pending TEE and ILR placement.   Shailynn Fong, DO 07/23/2016, 7:24 AM

## 2016-07-23 NOTE — Progress Notes (Signed)
Per Dr Gerilyn Pilgrim - stroke team will not see patient today. Will F/u when TEE has been performed.  Delton See PA-C [facility] Pager [phone] 07/23/2016, 9:40 AM

## 2016-07-24 DIAGNOSIS — I63441 Cerebral infarction due to embolism of right cerebellar artery: Secondary | ICD-10-CM

## 2016-07-24 DIAGNOSIS — I639 Cerebral infarction, unspecified: Principal | ICD-10-CM

## 2016-07-24 DIAGNOSIS — Z8673 Personal history of transient ischemic attack (TIA), and cerebral infarction without residual deficits: Secondary | ICD-10-CM

## 2016-07-24 DIAGNOSIS — E785 Hyperlipidemia, unspecified: Secondary | ICD-10-CM

## 2016-07-24 DIAGNOSIS — I10 Essential (primary) hypertension: Secondary | ICD-10-CM

## 2016-07-24 DIAGNOSIS — E1159 Type 2 diabetes mellitus with other circulatory complications: Secondary | ICD-10-CM

## 2016-07-24 DIAGNOSIS — Z794 Long term (current) use of insulin: Secondary | ICD-10-CM

## 2016-07-24 LAB — GLUCOSE, CAPILLARY
Glucose-Capillary: 205 mg/dL — ABNORMAL HIGH (ref 65–99)
Glucose-Capillary: 279 mg/dL — ABNORMAL HIGH (ref 65–99)
Glucose-Capillary: 189 mg/dL — ABNORMAL HIGH (ref 65–99)

## 2016-07-24 MED ORDER — INSULIN GLARGINE 100 UNIT/ML ~~LOC~~ SOLN
20.0000 [IU] | Freq: Every day | SUBCUTANEOUS | Status: DC
  Administered 2016-07-24: 20 [IU] via SUBCUTANEOUS
  Filled 2016-07-24 (×2): qty 0.2

## 2016-07-24 NOTE — Progress Notes (Signed)
Subjective: Patient laying in bed resting this morning.  Reports a headache on her left side, no associated dizziness or nausea.  Objective: Vital signs in last 24 hours: Vitals:   07/23/16 2047 07/24/16 0044 07/24/16 0522 07/24/16 0800  BP: (!) 141/77 (!) 110/56 (!) 127/59 (!) 141/84  Pulse: 84 79 75 85  Resp: 18 18 18 16   Temp: 98.2 F (36.8 C) 97.9 F (36.6 C) 97.8 F (36.6 C)   TempSrc: Oral Oral Oral   SpO2: 99% 98% 98% 99%  Weight:      Height:        Intake/Output Summary (Last 24 hours) at 07/24/16 1020 Last data filed at 07/23/16 2300  Gross per 24 hour  Intake              480 ml  Output                0 ml  Net              480 ml    Physical Exam: General appearance: well appearing, cooperative  Lungs: clear to auscultation bilaterally Heart: regular rate and rhythm, S1, S2 normal, no murmur, click, rub or gallop Extremities: extremities normal, atraumatic, no cyanosis or edema Neurologic: Right sided weakness in upper extremity    Lab Results: CBC Latest Ref Rng & Units 07/20/2016 07/19/2016  WBC 4.0 - 10.5 K/uL 8.1 12.1(H)  Hemoglobin 12.0 - 15.0 g/dL 40.9 81.1  Hematocrit 91.4 - 46.0 % 39.8 39.3  Platelets 150 - 400 K/uL 311 279    BMP Latest Ref Rng & Units 07/19/2016  Glucose 65 - 99 mg/dL 782(N)  BUN 6 - 20 mg/dL 10  Creatinine 5.62 - 1.30 mg/dL 8.65  Sodium 784 - 696 mmol/L 136  Potassium 3.5 - 5.1 mmol/L 4.0  Chloride 101 - 111 mmol/L 104  CO2 22 - 32 mmol/L 22  Calcium 8.9 - 10.3 mg/dL 2.9(B)    Studies/Results: No results found.  Medications:  Scheduled Meds: .  stroke: mapping our early stages of recovery book   Does not apply Once  . aspirin  81 mg Oral Daily  . atorvastatin  40 mg Oral q1800  . clopidogrel  75 mg Oral Daily  . DULoxetine  60 mg Oral BID  . enoxaparin (LOVENOX) injection  40 mg Subcutaneous Q24H  . feeding supplement (GLUCERNA SHAKE)  237 mL Oral TID BM  . insulin aspart  0-15 Units Subcutaneous TID WC  .  insulin glargine  18 Units Subcutaneous QHS  . lisinopril  5 mg Oral Daily   Continuous Infusions: PRN Meds:.acetaminophen **OR** acetaminophen (TYLENOL) oral liquid 160 mg/5 mL **OR** acetaminophen, diazepam, ramelteon, senna-docusate, sodium chloride flush   Assessment/Plan: Active Problems:   CVA (cerebral vascular accident) (HCC)   Cerebellar stroke [facility])  [name] is a 56 yo woman with a history of stroke, DM, HTN, and HLD who is improving after a right cerebellar stroke 2 days ago.  Acute Right Lateral Cerebellar Infarct: The patient has improved clinically since stroke.  Continuing medical management to reduce further stroke risk, currently awaiting TEE and ILR placement per stroke team consult. - TEE 6/20, ILR per EP - f/u outpatient in [facility], optimize DM regimine - continue ASA, clopidogrel - continue atorvastatin 40mg  - outpatient PT    DVT/PE prophylaxis:Lovenox FEN/GI:Regular diet Code:Full  Dispo: Anticipated discharge pending TEE and ILR placement.   This is a Psychologist, occupational Note.  The care of the patient was discussed with  Dr. Vincente Liberty and the assessment and plan formulated with their assistance.  Please see their attached note for official documentation of the daily encounter.   LOS: 5 days   Tonya Sanders, Medical Student 07/24/2016, 10:20 AM

## 2016-07-24 NOTE — Progress Notes (Addendum)
Physical Therapy Treatment Patient Details Name: Tonya Sanders MRN: [id number] DOB: [birth year] Today's Date: 07/24/2016    History of Present Illness Pt is a 56 y/o female with a history of stroke (with right visual field cut) who presents with an acute headache, spinning, and nausea. MRI revealed acute infarct in the lateral right cerebellum, Remote left PCA territory infarct affecting the occipital lobe.    PT Comments    Facilitated stair training and safety with cane with gait.  Con't to recommend outpatient PT.   Follow Up Recommendations  Outpatient PT;Supervision/Assistance - 24 hour     Equipment Recommendations  None recommended by PT    Recommendations for Other Services       Precautions / Restrictions Precautions Precautions: Fall Precaution Comments: R visual field cut Restrictions Weight Bearing Restrictions: No    Mobility  Bed Mobility Overal bed mobility: Modified Independent                Transfers Overall transfer level: Modified independent Equipment used: Straight cane Transfers: Sit to/from Stand Sit to Stand: Modified independent (Device/Increase time)         General transfer comment: Able to stand without UE A while cane height being adjusted  Ambulation/Gait Ambulation/Gait assistance: Supervision Ambulation Distance (Feet): 300 Feet Assistive device: Straight cane Gait Pattern/deviations: Step-through pattern Gait velocity: Decreased   General Gait Details: facilitated gait with head turns, nods with cane. Pt tends to reach out for outside support on opposite side of cane.  With cues she would not use support and still steady.   Stairs Stairs: Yes   Stair Management: One rail Right;With cane;Step to pattern;Forwards Number of Stairs: 10 General stair comments: Cues for cane placement.  With descent, pt did more sideways with both hands on rail.  Wheelchair Mobility    Modified Rankin (Stroke Patients Only) Modified  Rankin (Stroke Patients Only) Pre-Morbid Rankin Score: Slight disability Modified Rankin: Moderate disability     Balance Overall balance assessment: Needs assistance Sitting-balance support: No upper extremity supported;Feet supported Sitting balance-Leahy Scale: Good     Standing balance support: Single extremity supported;No upper extremity supported Standing balance-Leahy Scale: Fair Standing balance comment: able to stand without UE assist                            Cognition Arousal/Alertness: Awake/alert Behavior During Therapy: WFL for tasks assessed/performed Overall Cognitive Status: History of cognitive impairments - at baseline Area of Impairment: Safety/judgement                         Safety/Judgement: Decreased awareness of safety;Decreased awareness of deficits            Exercises      General Comments        Pertinent Vitals/Pain Faces Pain Scale: Hurts a little bit Pain Location: headache Pain Descriptors / Indicators: Headache Pain Intervention(s): Monitored during session    Home Living                      Prior Function            PT Goals (current goals can now be found in the care plan section) Acute Rehab PT Goals Patient Stated Goal: independent at home PT Goal Formulation: With patient Time For Goal Achievement: 07/27/16 Potential to Achieve Goals: Good Progress towards PT goals: Progressing toward goals    Frequency  Min 4X/week      PT Plan Current plan remains appropriate    Co-evaluation              AM-PAC PT "6 Clicks" Daily Activity  Outcome Measure  Difficulty turning over in bed (including adjusting bedclothes, sheets and blankets)?: A Little Difficulty moving from lying on back to sitting on the side of the bed? : A Little Difficulty sitting down on and standing up from a chair with arms (e.g., wheelchair, bedside commode, etc,.)?: A Little Help needed moving to and  from a bed to chair (including a wheelchair)?: A Little Help needed walking in hospital room?: A Little Help needed climbing 3-5 steps with a railing? : A Little 6 Click Score: 18    End of Session Equipment Utilized During Treatment: Gait belt Activity Tolerance: Patient tolerated treatment well Patient left: in chair;with call bell/phone within reach;with chair alarm set   PT Visit Diagnosis: Unsteadiness on feet (R26.81);Other abnormalities of gait and mobility (R26.89)     Time: 1103-1594 PT Time Calculation (min) (ACUTE ONLY): 19 min  Charges:  $Gait Training: 8-22 mins                    G Codes:       Oneil Behney L. Katrinka Blazing, [place] Pager [phone] 07/24/2016    Enzo Montgomery 07/24/2016, 2:16 PM

## 2016-07-24 NOTE — Progress Notes (Signed)
STROKE TEAM PROGRESS NOTE  SUBJECTIVE (INTERVAL HISTORY) No family at bedside. She is lying in bed. Still has TEE pending Wednesday. She is not compliant with medication for the last 3 months as she did not get medication refilled by her PCP in [place]. She also admitted that she had history of staring spells, however, during the spells, she was able to hear and feel but just not able to respond. He admitted the spells were associated with depression and anxiety, and if she was busy and active, there was no more spells.  OBJECTIVE Temp:  [97.8 F (36.6 C)-98.4 F (36.9 C)] 98.3 F (36.8 C) (06/18 1626) Pulse Rate:  [75-91] 91 (06/18 1626) Cardiac Rhythm: Normal sinus rhythm (06/18 0833) Resp:  [16-18] 17 (06/18 1626) BP: (110-141)/(56-84) 115/69 (06/18 1626) SpO2:  [98 %-100 %] 98 % (06/18 1626)  CBC:   Recent Labs Lab 07/19/16 0659 07/20/16 0420  WBC 12.1* 8.1  HGB 13.5 13.2  HCT 39.3 39.8  MCV 94.5 94.5  PLT 279 311    Basic Metabolic Panel:   Recent Labs Lab 07/19/16 0659  NA 136  K 4.0  CL 104  CO2 22  GLUCOSE 240*  BUN 10  CREATININE 0.61  CALCIUM 8.8*    Lipid Panel:     Component Value Date/Time   CHOL 155 07/20/2016 0420   TRIG 250 (H) 07/20/2016 0420   HDL 28 (L) 07/20/2016 0420   CHOLHDL 5.5 07/20/2016 0420   VLDL 50 (H) 07/20/2016 0420   LDLCALC 77 07/20/2016 0420   HgbA1c:  Lab Results  Component Value Date   HGBA1C 8.8 (H) 07/20/2016   Urine Drug Screen: No results found for: LABOPIA, COCAINSCRNUR, LABBENZ, AMPHETMU, THCU, LABBARB  Alcohol Level No results found for: [facility]  IMAGING I have personally reviewed the radiological images below and agree with the radiology interpretations.  Dg Chest 2 View 07/19/2016 Low lung volumes. Perhaps mild interstitial thickening/edema. No evidence of pneumonia or overt alveolar pulmonary edema.   Ct Head Wo Contrast 07/19/2016 1. Age-indeterminate small right cerebellar infarct. Provided history  suggests recent infarct, suggest MR correlation.  2. Age-indeterminate right thalamus lacunar infarct.  3. Remote left PCA distribution infarct, new from 2008.   Mr Brain Wo Contrast 07/19/2016  1. Moderate acute infarct in the lateral right cerebellum.  2. Small remote right cerebellar infarct adjacent to #1.  3. Remote left PCA territory infarct affecting the occipital lobe. Superimposed foci of diffusion hyperintensity are likely chronic/artifactual.  4. Symmetric mild subcortical and cortical gliosis along the frontoparietal convexities, possible old watershed infarcts.   Ct Angio Head W Or Wo Contrast Ct Angio Neck W Or Wo Contrast 07/20/2016  CTA NECK  1. Negative CTA of the neck. No high-grade or critical stenosis within the major arterial vasculature of the neck.  2. Aberrant right subclavian artery.  3. Filling defect within the mid-distal left internal jugular vein, compatible with nonocclusive jugular venous thrombosis.  CTA HEAD 1. Chronic proximal left P2 occlusion.  2. Otherwise negative CTA for large or proximal arterial branch occlusion. No other high-grade or correctable stenosis.  3. Small focal irregular filling defect within the distal left transverse sinus, suspicious for small amount of nonocclusive thrombus given the findings in the neck.   TEE pending  LE venous Doppler pending   PHYSICAL EXAM  Temp:  [97.8 F (36.6 C)-98.4 F (36.9 C)] 98.3 F (36.8 C) (06/18 1626) Pulse Rate:  [75-91] 91 (06/18 1626) Resp:  [16-18] 17 (06/18 1626)  BP: (110-141)/(56-84) 115/69 (06/18 1626) SpO2:  [98 %-100 %] 98 % (06/18 1626)  General - obese, well developed, in no apparent distress.  Ophthalmologic - Fundi not visualized due to noncooperation.  Cardiovascular - Regular rate and rhythm.  Mental Status -  Level of arousal and orientation to time, place, and person were intact. Language including expression, naming, repetition, comprehension was assessed and  found intact. Fund of Knowledge was assessed and was intact.  Cranial Nerves II - XII - II - right incomplete homonymous hemianopia. III, IV, VI - Extraocular movements intact. V - Facial sensation intact bilaterally. VII - Facial movement intact bilaterally. VIII - Hearing & vestibular intact bilaterally. X - Palate elevates symmetrically, poor denture without teeth. XI - Chin turning & shoulder shrug intact bilaterally. XII - Tongue protrusion intact.  Motor Strength - The patient's strength was normal in all extremities and pronator drift was absent.  Bulk was normal and fasciculations were absent.   Motor Tone - Muscle tone was assessed at the neck and appendages and was normal.  Reflexes - The patient's reflexes were 1+ in all extremities and she had no pathological reflexes.  Sensory - Light touch, temperature/pinprick were assessed and were symmetrical.    Coordination - The patient had normal movements in the hands with no ataxia or dysmetria.  Tremor was absent.  Gait and Station - deferred.   ASSESSMENT/PLAN [name] is a 56 y.o. female with history of arthritis, GERD, HLD, HTN, neuropathy, seizures, DM, HLD not compliant with medication and prior stroke presenting with nausea and vomiting, HA and dizziness. She did not receive IV t-PA due to delay in arrival.   Stroke:   R acute AICA territory infarct with previous right PICA and left PCA infarcts concerning for embolic etiology, source unclear.   CT head small R cerebellar infarct, old R thalamic lacune. Old L PCA infarct since 2008.  MRI head lateral R cerebellar infarct. Old R cerebellar infarct. Old L PCA infarct.   CTA head and neck unremarkable except left P2 occlusion  2D Echo  EF 55-60%  TEE and loop pending.   LE venous Doppler pending  Hypercoag workup pending.  LDL 77  HgbA1c 8.8  Lovenox 40 mg sq daily for VTE prophylaxis Diet regular Room service appropriate? Yes; Fluid consistency:  Thin Diet NPO time specified Except for: Sips with Meds  Noncompliance with medication, now on aspirin 81 mg daily and clopidogrel 75 mg daily. 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  Staring spells but able to hear and feel, just not able to respond  Related to anxiety and depression as per patient  No spells if remains active and busy as per patient  Hypertension  Stable Permissive hypertension (OK if < 220/120) but gradually normalize in 5-7 days Long-term BP goal normotensive  Hyperlipidemia  Now on lipitor 40mg  daily  LDL 77, goal < 70  Continue statin at discharge  Diabetes type II  Hyperglycemia  HgbA1c 8.8, goal < 7.0  Uncontrolled  On Lantus  SSI  Other Stroke Risk Factors  Former Cigarette smoker  ETOH use, advised to drink no more than 1 drink(s) a day  Obesity, Body mass index is 38.86 kg/m., recommend  weight loss, diet and exercise as appropriate   Other Active Problems  GERD  Hospital day # 5  Marvel Plan, MD PhD Stroke Neurology 07/24/2016 5:50 PM

## 2016-07-24 NOTE — Progress Notes (Signed)
Plan is to d/c home with outpatient therapy after TEE. CM following.

## 2016-07-24 NOTE — Progress Notes (Signed)
PT Cancellation Note  Patient Details Name: Tonya Sanders MRN: [id number] DOB: 1960-05-19   Cancelled Treatment:    Reason Eval/Treat Not Completed: Patient declined, stating she is too sleepy and that she took something last night to help her sleep and it is still in her system.  Will check back as schedule permits.   Enzo Montgomery 07/24/2016, 9:09 AM

## 2016-07-24 NOTE — Progress Notes (Signed)
   Subjective: Patient seen and evaluated at bedside. Continues to complain of HA, worse on left. A little dizziness. No nausea or vomiting. Reports history of '3 strokes'   Objective:  Vital signs in last 24 hours: Vitals:   07/23/16 1600 07/23/16 2047 07/24/16 0044 07/24/16 0522  BP: 140/81 (!) 141/77 (!) 110/56 (!) 127/59  Pulse: 82 84 79 75  Resp: 18 18 18 18   Temp: 98.7 F (37.1 C) 98.2 F (36.8 C) 97.9 F (36.6 C) 97.8 F (36.6 C)  TempSrc: Oral Oral Oral Oral  SpO2: 98% 99% 98% 98%  Weight:      Height:       General: Well appearing caucasian female, resting in bed, NAD. Looks a little fatigued. Cardiac: RRR, no rubs, murmurs or gallops Pulm: CTA BL. Normal WOB on RA.  Ext: warm and perfused. No pedal edema appreciated. No erythema.  Neuro: Strength and sensation grossly intact EXCEPT for persistent mild right upper extremity weakness. Responds to questions appropriately  Assessment/Plan:  Active Problems:   CVA (cerebral vascular accident) (HCC)   Cerebellar stroke (HCC)  #Acute right lateral cerebellar infarct ##Hx of scattered BL CVAs ###Non-occlusive left mid-distal jugular venous thrombosis & non-occlusive left transverse sinus thrombosis  MR brain with acute right cerebellar infarction with BL multifocal infacts concerning for embolic source. CTA with non-occlusive filling defect of mid-distal left JV consistent with thrombosis. Also, small focal filling defect in left transverse sinus, suspicious for small non-occlusive thrombus. -Anti-phospholipid panel -Awaiting TEE and loop recorder, appears to be scheduled 6/20.  -ASA + Plavix -Atorvastatin 40mg  -Diazepam 5mg  Q12H prn dizziness. Hasnt required any in several days -QHS PRN sleep  #Hypertension ##Hypercholesterolemia  -Lisinopril 5 mg daily, BP a little elevated this morning at 141/84. Consider increase in lisinopril should it remain elevated throughout the next 24 hours.  -Atorvastatin  #Diabetes    CBGs 200's on current regimen. Will increase basal and continue sliding scale.  -Lantus 20 units QHS -Moderate sliding scale -Hg A1c 8.8  #Seizures -Seizure precautions   Dispo: Anticipated discharge pending TEE and ILR placement.   Marquis Diles, DO 07/24/2016, 6:48 AM

## 2016-07-24 NOTE — Progress Notes (Signed)
Medicine attending: I examined this patient today and reviewed pertinent clinical and laboratory data and I concur with the evaluation and management plan to be detailed in subsequent progress note by resident physician Dr. Toma Copier Molt. 56 year old woman with hypertension, type 2 diabetes, and hyperlipidemia.  Former smoker.  She has issues with medication compliance.  She had a previous stroke.  She believes she has had 4 strokes in the past.  Details not available.  [facility]  She presented on the day of the current admission June 13 with sudden onset of headache, dizziness, and nausea.  CT shows a acute infarct in the lateral right cerebellum in addition to changes consistent with a previous right cerebellar infarct and a remote left PCA territory infarct.  Findings confirmed on MRI.  On initial exam she was found to have nystagmus.  A right visual field cut.  Subtle right-sided dysmetria and slower right finger to nose exam.  She was started on aspirin and Plavix.  Although cardiogram shows normal sinus rhythm, and transthoracic echocardiogram with no major abnormalities, neurology concerned with possibility of embolic stroke and is recommending placement of a transesophageal echocardiogram and placement of a loop recorder to look for occult atrial fibrillation. Neurologic exam stable at this time.  Currently no nystagmus.  Subtle right upper extremity weakness and clumsiness on rapid movements of the right hand along with decreased right hand grip likely sequelae of previous strokes.
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
## 2016-07-24 NOTE — Progress Notes (Signed)
Occupational Therapy Treatment Patient Details Name: Tonya Sanders MRN: [id number] DOB: 19-May-1960 Today's Date: 07/24/2016    History of present illness Pt is a 56 y/o female with a history of stroke (with right visual field cut) who presents with an acute headache, spinning, and nausea. MRI revealed acute infarct in the lateral right cerebellum, Remote left PCA territory infarct affecting the occipital lobe.   OT comments  Pt making progress toward OT goals; able to perform tub transfer sitting in bottom of tub with supervision for safety. Pt reports visual deficits improving but not back to baseline yet. D/c plan remains appropriate. Will continue to follow acutely.   Follow Up Recommendations  Outpatient OT;Supervision - Intermittent    Equipment Recommendations  None recommended by OT    Recommendations for Other Services      Precautions / Restrictions Precautions Precautions: Fall Precaution Comments: R visual field cut Restrictions Weight Bearing Restrictions: No       Mobility Bed Mobility Overal bed mobility: Modified Independent                Transfers Overall transfer level: Needs assistance Equipment used: None Transfers: Sit to/from Stand Sit to Stand: Supervision         General transfer comment: for safety. mild balance deficits but no LOB    Balance Overall balance assessment: Needs assistance Sitting-balance support: Feet supported;No upper extremity supported Sitting balance-Leahy Scale: Good     Standing balance support: No upper extremity supported;During functional activity Standing balance-Leahy Scale: Good                             ADL either performed or assessed with clinical judgement   ADL Overall ADL's : Needs assistance/impaired                                 Tub/ Shower Transfer: Supervision/safety;Ambulation Tub/Shower Transfer Details (indicate cue type and reason): Pt reports she  typically sits in bottom of tub for bathing, pt able to demo tub transfer technique and sitting in bottom of tub with supervision for safety. Recommend boyfriend supervises transfers; pt and him are agreeable. Functional mobility during ADLs: Supervision/safety General ADL Comments: Pt reports improved visual deficits but not back to baseline     Vision       Perception     Praxis      Cognition Arousal/Alertness: Awake/alert Behavior During Therapy: WFL for tasks assessed/performed Overall Cognitive Status: History of cognitive impairments - at baseline                           Safety/Judgement: Decreased awareness of deficits              Exercises     Shoulder Instructions       General Comments      Pertinent Vitals/ Pain       Pain Assessment: Faces Faces Pain Scale: No hurt  Home Living                                          Prior Functioning/Environment              Frequency  Min 2X/week        Progress  Toward Goals  OT Goals(current goals can now be found in the care plan section)  Progress towards OT goals: Progressing toward goals  Acute Rehab OT Goals Patient Stated Goal: return home OT Goal Formulation: With patient ADL Goals Pt Will Perform Tub/Shower Transfer: with modified independence;Tub transfer;ambulating (sitting in bottom of tub)  Plan Discharge plan remains appropriate;Frequency remains appropriate    Co-evaluation                 AM-PAC PT "6 Clicks" Daily Activity     Outcome Measure   Help from another person eating meals?: None Help from another person taking care of personal grooming?: None Help from another person toileting, which includes using toliet, bedpan, or urinal?: None Help from another person bathing (including washing, rinsing, drying)?: A Little Help from another person to put on and taking off regular upper body clothing?: None Help from another person to put  on and taking off regular lower body clothing?: A Little 6 Click Score: 22    End of Session    OT Visit Diagnosis: Unsteadiness on feet (R26.81);Other symptoms and signs involving the nervous system (R29.898)   Activity Tolerance Patient tolerated treatment well   Patient Left in bed;with call bell/phone within reach;with family/visitor present   Nurse Communication          Time: 8119-1478 OT Time Calculation (min): 11 min  Charges: OT General Charges $OT Visit: 1 Procedure OT Treatments $Self Care/Home Management : 8-22 mins  Jaaziel Peatross A. Brett Albino, M.S., OTR/L Pager: [phone]   Gaye Alken 07/24/2016, 4:22 PM

## 2016-07-25 ENCOUNTER — Inpatient Hospital Stay (HOSPITAL_COMMUNITY): Payer: Medicare Other

## 2016-07-25 DIAGNOSIS — M7989 Other specified soft tissue disorders: Secondary | ICD-10-CM

## 2016-07-25 DIAGNOSIS — E785 Hyperlipidemia, unspecified: Secondary | ICD-10-CM

## 2016-07-25 DIAGNOSIS — R569 Unspecified convulsions: Secondary | ICD-10-CM

## 2016-07-25 DIAGNOSIS — E1169 Type 2 diabetes mellitus with other specified complication: Secondary | ICD-10-CM

## 2016-07-25 DIAGNOSIS — E669 Obesity, unspecified: Secondary | ICD-10-CM

## 2016-07-25 DIAGNOSIS — I10 Essential (primary) hypertension: Secondary | ICD-10-CM

## 2016-07-25 LAB — CBC
HEMATOCRIT: 38.8 % (ref 36.0–46.0)
Hemoglobin: 12.8 g/dL (ref 12.0–15.0)
MCH: 31.5 pg (ref 26.0–34.0)
MCHC: 33 g/dL (ref 30.0–36.0)
MCV: 95.6 fL (ref 78.0–100.0)
PLATELETS: 270 10*3/uL (ref 150–400)
RBC: 4.06 MIL/uL (ref 3.87–5.11)
RDW: 12.2 % (ref 11.5–15.5)
WBC: 9.3 10*3/uL (ref 4.0–10.5)

## 2016-07-25 LAB — GLUCOSE, CAPILLARY
GLUCOSE-CAPILLARY: 248 mg/dL — AB (ref 65–99)
GLUCOSE-CAPILLARY: 209 mg/dL — AB (ref 65–99)
GLUCOSE-CAPILLARY: 291 mg/dL — AB (ref 65–99)
Glucose-Capillary: 201 mg/dL — ABNORMAL HIGH (ref 65–99)

## 2016-07-25 LAB — BASIC METABOLIC PANEL
ANION GAP: 7 (ref 5–15)
BUN: 18 mg/dL (ref 6–20)
CO2: 29 mmol/L (ref 22–32)
Calcium: 9.2 mg/dL (ref 8.9–10.3)
Chloride: 102 mmol/L (ref 101–111)
Creatinine, Ser: 0.65 mg/dL (ref 0.44–1.00)
GFR calc Af Amer: >60 mL/min (ref >60)
GFR calc non Af Amer: >60 mL/min (ref >60)
GLUCOSE: 233 mg/dL — AB (ref 65–99)
POTASSIUM: 3.9 mmol/L (ref 3.5–5.1)
Sodium: 138 mmol/L (ref 135–145)

## 2016-07-25 MED ORDER — INSULIN GLARGINE 100 UNIT/ML ~~LOC~~ SOLN
22.0000 [IU] | Freq: Every day | SUBCUTANEOUS | Status: DC
  Administered 2016-07-25: 22 [IU] via SUBCUTANEOUS
  Filled 2016-07-25 (×2): qty 0.22

## 2016-07-25 NOTE — Progress Notes (Signed)
IV team unable to draw labs from RUE midline, lab called to draw labs.  Per IV RN, dayshift IV team can attempt again through midline.  Continue to monitor.

## 2016-07-25 NOTE — Progress Notes (Signed)
STROKE TEAM PROGRESS NOTE  SUBJECTIVE (INTERVAL HISTORY) No family at bedside. She is lying in bed. No complains. TEE pending Wednesday.   OBJECTIVE Temp:  [98.1 F (36.7 C)-98.4 F (36.9 C)] 98.1 F (36.7 C) (06/19 0915) Pulse Rate:  [73-91] 78 (06/19 0915) Cardiac Rhythm: Normal sinus rhythm (06/19 0800) Resp:  [16-19] 19 (06/19 0915) BP: (112-137)/(65-78) 137/77 (06/19 0915) SpO2:  [96 %-100 %] 99 % (06/19 0629)  CBC:   Recent Labs Lab 07/20/16 0420 07/25/16 0603  WBC 8.1 9.3  HGB 13.2 12.8  HCT 39.8 38.8  MCV 94.5 95.6  PLT 311 270    Basic Metabolic Panel:   Recent Labs Lab 07/19/16 0659 07/25/16 0603  NA 136 138  K 4.0 3.9  CL 104 102  CO2 22 29  GLUCOSE 240* 233*  BUN 10 18  CREATININE 0.61 0.65  CALCIUM 8.8* 9.2    Lipid Panel:     Component Value Date/Time   CHOL 155 07/20/2016 0420   TRIG 250 (H) 07/20/2016 0420   HDL 28 (L) 07/20/2016 0420   CHOLHDL 5.5 07/20/2016 0420   VLDL 50 (H) 07/20/2016 0420   LDLCALC 77 07/20/2016 0420   HgbA1c:  Lab Results  Component Value Date   HGBA1C 8.8 (H) 07/20/2016   Urine Drug Screen: No results found for: LABOPIA, COCAINSCRNUR, LABBENZ, AMPHETMU, THCU, LABBARB  Alcohol Level No results found for: [facility]  IMAGING I have personally reviewed the radiological images below and agree with the radiology interpretations.  Dg Chest 2 View 07/19/2016 Low lung volumes. Perhaps mild interstitial thickening/edema. No evidence of pneumonia or overt alveolar pulmonary edema.   Ct Head Wo Contrast 07/19/2016 1. Age-indeterminate small right cerebellar infarct. Provided history suggests recent infarct, suggest MR correlation.  2. Age-indeterminate right thalamus lacunar infarct.  3. Remote left PCA distribution infarct, new from 2008.   Mr Brain Wo Contrast 07/19/2016  1. Moderate acute infarct in the lateral right cerebellum.  2. Small remote right cerebellar infarct adjacent to #1.  3. Remote left PCA territory  infarct affecting the occipital lobe. Superimposed foci of diffusion hyperintensity are likely chronic/artifactual.  4. Symmetric mild subcortical and cortical gliosis along the frontoparietal convexities, possible old watershed infarcts.   Ct Angio Head W Or Wo Contrast Ct Angio Neck W Or Wo Contrast 07/20/2016  CTA NECK  1. Negative CTA of the neck. No high-grade or critical stenosis within the major arterial vasculature of the neck.  2. Aberrant right subclavian artery.  3. Filling defect within the mid-distal left internal jugular vein, compatible with nonocclusive jugular venous thrombosis.  CTA HEAD 1. Chronic proximal left P2 occlusion.  2. Otherwise negative CTA for large or proximal arterial branch occlusion. No other high-grade or correctable stenosis.  3. Small focal irregular filling defect within the distal left transverse sinus, suspicious for small amount of nonocclusive thrombus given the findings in the neck.   TEE pending  LE venous Doppler No evidence of deep vein thrombosis in the visualized veins, negative for baker's cysts bilaterally.   PHYSICAL EXAM  Temp:  [98.1 F (36.7 C)-98.4 F (36.9 C)] 98.1 F (36.7 C) (06/19 0915) Pulse Rate:  [73-91] 78 (06/19 0915) Resp:  [16-19] 19 (06/19 0915) BP: (112-137)/(65-78) 137/77 (06/19 0915) SpO2:  [96 %-100 %] 99 % (06/19 0629)  General - obese, well developed, in no apparent distress.  Ophthalmologic - Fundi not visualized due to noncooperation.  Cardiovascular - Regular rate and rhythm.  Mental Status -  Level of  arousal and orientation to time, place, and person were intact. Language including expression, naming, repetition, comprehension was assessed and found intact. Fund of Knowledge was assessed and was intact.  Cranial Nerves II - XII - II - right incomplete homonymous hemianopia. III, IV, VI - Extraocular movements intact. V - Facial sensation intact bilaterally. VII - Facial movement intact  bilaterally. VIII - Hearing & vestibular intact bilaterally. X - Palate elevates symmetrically, poor denture without teeth. XI - Chin turning & shoulder shrug intact bilaterally. XII - Tongue protrusion intact.  Motor Strength - The patient's strength was normal in all extremities and pronator drift was absent.  Bulk was normal and fasciculations were absent.   Motor Tone - Muscle tone was assessed at the neck and appendages and was normal.  Reflexes - The patient's reflexes were 1+ in all extremities and she had no pathological reflexes.  Sensory - Light touch, temperature/pinprick were assessed and were symmetrical.    Coordination - The patient had normal movements in the hands with no ataxia or dysmetria.  Tremor was absent.  Gait and Station - deferred.   ASSESSMENT/PLAN [name] is a 56 y.o. female with history of arthritis, GERD, HLD, HTN, neuropathy, seizures, DM, HLD not compliant with medication and prior stroke presenting with nausea and vomiting, HA and dizziness. She did not receive IV t-PA due to delay in arrival.   Stroke:   R acute AICA territory infarct with previous right PICA and left PCA infarcts concerning for embolic etiology, source unclear.   CT head small R cerebellar infarct, old R thalamic lacune. Old L PCA infarct since 2008.  MRI head lateral R cerebellar infarct. Old R cerebellar infarct. Old L PCA infarct.   CTA head and neck unremarkable except left P2 occlusion  2D Echo  EF 55-60%  TEE and loop pending.   LE venous Doppler no DVT  Hypercoag workup pending.  LDL 77  HgbA1c 8.8  UDS pending  Lovenox 40 mg sq daily for VTE prophylaxis Diet regular Room service appropriate? Yes; Fluid consistency: Thin Diet NPO time specified Except for: Sips with Meds  Noncompliance with medication, now on aspirin 81 mg daily and clopidogrel 75 mg daily. 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  Staring spells but able to hear and feel, just not able to respond  Related to anxiety and depression as per patient  No spells if remains active and busy as per patient  Hypertension  Stable Permissive hypertension (OK if < 220/120) but gradually normalize in 5-7 days Long-term BP goal normotensive  Hyperlipidemia  Now on lipitor 40mg  daily  LDL 77, goal < 70  Continue statin at discharge  Diabetes type II  Hyperglycemia  HgbA1c 8.8, goal < 7.0  Uncontrolled  On Lantus  SSI  Other Stroke Risk Factors  Former Cigarette smoker  ETOH use, advised to drink no more than 1 drink(s) a day  Obesity, Body mass index is 38.86 kg/m., recommend weight loss, diet and exercise as appropriate   Other Active Problems  GERD  Hospital day # 6  Marvel Plan, MD PhD Stroke Neurology 07/25/2016 10:39 AM

## 2016-07-25 NOTE — Progress Notes (Signed)
Results for LEILANI, POLSTER (MRN [id number]) as of 07/25/2016 13:29  Ref. Range 07/24/2016 06:16 07/24/2016 11:45 07/24/2016 16:19 07/25/2016 06:27 07/25/2016 11:22  Glucose-Capillary Latest Ref Range: 65 - 99 mg/dL 924 (H) 268 (H) 341 (H) 201 (H) 248 (H)  Noted that blood sugars have been greater than 180 mg/dl. Recommend increasing Lantus to 25 units daily if blood sugars continue to be elevated.    Smith Mince RN BSN CDE Diabetes Coordinator Pager: [phone]  8am-5pm

## 2016-07-25 NOTE — Progress Notes (Signed)
   Subjective:  Patient seen and evaluated at bedside this morning. Feels well and is without complaint. Eager for discharge. Denies any dizziness.   Objective:  Vital signs in last 24 hours: Vitals:   07/24/16 2051 07/25/16 0125 07/25/16 0629 07/25/16 0915  BP: 125/66 112/78 130/75 137/77  Pulse: 86 88 73 78  Resp: 18 18 16 19   Temp: 98.2 F (36.8 C) 98.3 F (36.8 C) 98.2 F (36.8 C) 98.1 F (36.7 C)  TempSrc: Oral Oral Oral Oral  SpO2: 96% 98% 99%   Weight:      Height:       General: Well appearing caucasian female, resting in bed, NAD. Cardiac: RRR, no rubs or murmurs were appreciated Pulm: CTA BL. Normal WOB on RA.  Ext: warm and perfused. No pedal edema appreciated. No erythema.  Neuro: Strength and sensation grossly intact EXCEPT for persistent mild right upper extremity weakness. Responds to questions appropriately  Assessment/Plan:  #Acute right lateral cerebellar infarct ##Hx of scattered BL CVAs ###Non-occlusive left mid-distal jugular venous thrombosis & non-occlusive left transverse sinus thrombosis  MR brain with acute right cerebellar infarction with BL multifocal infacts concerning for embolic source. CTA with non-occlusive filling defect of mid-distal left JV consistent with thrombosis. Also, small focal filling defect in left transverse sinus, suspicious for small non-occlusive thrombus. -Awaiting TEE and loop recorder, both planned for 6/20.  -ASA + Plavix -Atorvastatin 40mg  -Diazepam 5mg  Q12H prn dizziness. Hasnt required any in several days -Ramelteon QHS PRN sleep  #Hypertension ##Hypercholesterolemia  -Lisinopril 5 mg daily -Atorvastatin  #Diabetes  CBGs 200's on current regimen. Will increase basal and continue sliding scale.  -Lantus 22 units QHS -Moderate sliding scale -Hg A1c 8.8  #Seizures -Seizure precautions   Dispo: Anticipated discharge pending TEE and ILR placement.   Darreld Mclean, MD 07/25/2016, 2:21 PM

## 2016-07-25 NOTE — Progress Notes (Signed)
  Subjective: Patient reports feeling well this morning.  She continues to express desire to go home and is looking forward to TEE being done tomorrow.  Objective: Vital signs in last 24 hours: Vitals:   07/24/16 2051 07/25/16 0125 07/25/16 0629 07/25/16 0915  BP: 125/66 112/78 130/75 137/77  Pulse: 86 88 73 78  Resp: 18 18 16 19   Temp: 98.2 F (36.8 C) 98.3 F (36.8 C) 98.2 F (36.8 C) 98.1 F (36.7 C)  TempSrc: Oral Oral Oral Oral  SpO2: 96% 98% 99%   Weight:      Height:       No intake or output data in the 24 hours ending 07/25/16 1124  Physical Exam: General appearance: well appearing, cooperative  Lungs: clear to auscultation bilaterally Heart: regular rate and rhythm, S1, S2 normal, no murmur, click, rub or gallop Extremities: extremities normal, atraumatic, no cyanosis or edema Neurologic: Right sided weakness in upper extremity    Lab Results: CBC Latest Ref Rng & Units 07/25/2016 07/20/2016 07/19/2016  WBC 4.0 - 10.5 K/uL 9.3 8.1 12.1(H)  Hemoglobin 12.0 - 15.0 g/dL 44.0 10.2 72.5  Hematocrit 36.0 - 46.0 % 38.8 39.8 39.3  Platelets 150 - 400 K/uL 270 311 279    BMP Latest Ref Rng & Units 07/25/2016 07/19/2016  Glucose 65 - 99 mg/dL 366(Y) 403(K)  BUN 6 - 20 mg/dL 18 10  Creatinine 7.42 - 1.00 mg/dL 5.95 6.38  Sodium 756 - 145 mmol/L 138 136  Potassium 3.5 - 5.1 mmol/L 3.9 4.0  Chloride 101 - 111 mmol/L 102 104  CO2 22 - 32 mmol/L 29 22  Calcium 8.9 - 10.3 mg/dL 9.2 4.3(P)    Studies/Results: No results found.  Medications:  Scheduled Meds: .  stroke: mapping our early stages of recovery book   Does not apply Once  . aspirin  81 mg Oral Daily  . atorvastatin  40 mg Oral q1800  . clopidogrel  75 mg Oral Daily  . DULoxetine  60 mg Oral BID  . enoxaparin (LOVENOX) injection  40 mg Subcutaneous Q24H  . feeding supplement (GLUCERNA SHAKE)  237 mL Oral TID BM  . insulin aspart  0-15 Units Subcutaneous TID WC  . insulin glargine  20 Units Subcutaneous QHS    . lisinopril  5 mg Oral Daily   Continuous Infusions: PRN Meds:.acetaminophen **OR** acetaminophen (TYLENOL) oral liquid 160 mg/5 mL **OR** acetaminophen, diazepam, ramelteon, senna-docusate, sodium chloride flush   Assessment/Plan: Active Problems:   CVA (cerebral vascular accident) (HCC)   Cerebellar stroke [facility])  Tonya Sanders is a 56 yo woman with a history of stroke, DM, HTN, and HLD who is improving after a right cerebellar stroke.  Acute Right Lateral Cerebellar Infarct: The patient has improved clinically since stroke.  Continuing medical management to reduce further stroke risk, currently awaiting TEE and ILR placement per stroke team consult. - TEE 6/20, ILR per EP - hypercoag workup per stroke team - f/u outpatient in [facility], optimize DM regimine - continue ASA, clopidogrel - continue atorvastatin 40mg  - outpatient PT    DVT/PE prophylaxis:Lovenox FEN/GI:Regular diet Code:Full  Dispo: Anticipated discharge pending TEE and ILR placement.   This is a Psychologist, occupational Note.  The care of the patient was discussed with Dr. Vincente Liberty and the assessment and plan formulated with their assistance.  Please see their attached note for official documentation of the daily encounter.   LOS: 6 days   Mikal Plane, Medical Student 07/25/2016, 11:24 AM

## 2016-07-25 NOTE — Progress Notes (Signed)
Plan is for TEE tomorrow then home with outpatient therapy. Outpatient therapy set up through [facility]. Pt has shower stool at bedside. CM notified Clydie Braun with [facility] of need for cane. She will have equipment delivered to the bedside.  CM following.

## 2016-07-25 NOTE — Progress Notes (Signed)
*  PRELIMINARY RESULTS* Vascular Ultrasound Bilateral lower extremity venous duplex has been completed.  Preliminary findings: No evidence of deep vein thrombosis in the visualized veins, negative for baker's cysts bilaterally.   Chauncey Fischer 07/25/2016, 12:19 PM

## 2016-07-25 NOTE — Progress Notes (Signed)
Medicine attending: I examined this patient today and I concur with the evaluation and management plan as recorded by resident physician Dr. Darreld Mclean. She has no residual cerebellar signs.  Blood pressure stable.  Blood sugars in reasonable range.  Lipid profile normal.  Transesophageal echocardiogram scheduled for tomorrow and then anticipate discharge.  Possible placement of a loop recorder. Impression: Recurrent stroke right cerebellum Known risk factors for cerebrovascular disease including diabetes, hypertension, hyperlipidemia currently on treatment.  Currently stable on aspirin and Plavix.  Plan as above.

## 2016-07-25 NOTE — Progress Notes (Signed)
Physical Therapy Treatment Patient Details Name: Tonya Sanders MRN: [id number] DOB: [birth year] Today's Date: 07/25/2016    History of Present Illness Pt is a 56 y/o female with a history of stroke (with right visual field cut) who presents with an acute headache, spinning, and nausea. MRI revealed acute infarct in the lateral right cerebellum, Remote left PCA territory infarct affecting the occipital lobe.    PT Comments    Pt has mad steady gains.  Gait generally steady with cane helpful when nothing stationary to hold to.  Still not coordinated with cane, but cane is now helpful.   Follow Up Recommendations  Outpatient PT;Supervision/Assistance - 24 hour     Equipment Recommendations  None recommended by PT    Recommendations for Other Services       Precautions / Restrictions Precautions Precautions: Fall    Mobility  Bed Mobility Overal bed mobility: Modified Independent                Transfers Overall transfer level: Modified independent                  Ambulation/Gait Ambulation/Gait assistance: Supervision Ambulation Distance (Feet): 250 Feet Assistive device: Straight cane Gait Pattern/deviations: Step-through pattern Gait velocity: Decreased Gait velocity interpretation: Below normal speed for age/gender General Gait Details: use of cane still mildly uncoordinated, but much improved.  Gait generally steady with pt able to scan, back up, change speed without overt deviation.   Stairs Stairs: Yes   Stair Management: One rail Right;With cane;Step to pattern;Forwards;Alternating pattern Number of Stairs: 12 General stair comments: cue for cane use.  Generally safe with just use of rail.  Wheelchair Mobility    Modified Rankin (Stroke Patients Only) Modified Rankin (Stroke Patients Only) Pre-Morbid Rankin Score: Slight disability Modified Rankin: Moderate disability     Balance Overall balance assessment: Needs assistance   Sitting  balance-Leahy Scale: Good       Standing balance-Leahy Scale: Good                              Cognition Arousal/Alertness: Awake/alert Behavior During Therapy: WFL for tasks assessed/performed Overall Cognitive Status: Within Functional Limits for tasks assessed                                        Exercises      General Comments        Pertinent Vitals/Pain Pain Assessment: No/denies pain Faces Pain Scale: No hurt    Home Living                      Prior Function            PT Goals (current goals can now be found in the care plan section) Acute Rehab PT Goals Patient Stated Goal: return home PT Goal Formulation: With patient Time For Goal Achievement: 07/27/16 Potential to Achieve Goals: Good Progress towards PT goals: Progressing toward goals    Frequency    Min 4X/week      PT Plan Current plan remains appropriate    Co-evaluation              AM-PAC PT "6 Clicks" Daily Activity  Outcome Measure  Difficulty turning over in bed (including adjusting bedclothes, sheets and blankets)?: None Difficulty moving from lying on back to sitting  on the side of the bed? : None Difficulty sitting down on and standing up from a chair with arms (e.g., wheelchair, bedside commode, etc,.)?: None Help needed moving to and from a bed to chair (including a wheelchair)?: A Little Help needed walking in hospital room?: A Little Help needed climbing 3-5 steps with a railing? : A Little 6 Click Score: 21    End of Session   Activity Tolerance: Patient tolerated treatment well Patient left: in bed;with call bell/phone within reach Nurse Communication: Mobility status PT Visit Diagnosis: Other symptoms and signs involving the nervous system (R29.898);Unsteadiness on feet (R26.81)     Time: 0973-5329 PT Time Calculation (min) (ACUTE ONLY): 21 min  Charges:  $Gait Training: 8-22 mins                    G Codes:        08/08/16  Torrington Bing, PT [phone]  (pager)   Tonya Sanders 2016-08-08, 12:44 PM

## 2016-07-26 ENCOUNTER — Inpatient Hospital Stay (HOSPITAL_COMMUNITY): Payer: Medicare Other

## 2016-07-26 ENCOUNTER — Encounter (HOSPITAL_COMMUNITY)
Admission: EM | Disposition: A | Discharge status: Home / Self Care | Payer: Self-pay | Source: Home / Self Care | Attending: Student in an Organized Health Care Education/Training Program

## 2016-07-26 ENCOUNTER — Encounter (HOSPITAL_COMMUNITY): Payer: Self-pay

## 2016-07-26 ENCOUNTER — Ambulatory Visit: Payer: Medicaid Other

## 2016-07-26 DIAGNOSIS — I638 Other cerebral infarction: Secondary | ICD-10-CM

## 2016-07-26 HISTORY — PX: TEE WITHOUT CARDIOVERSION: SHX5443

## 2016-07-26 HISTORY — PX: LOOP RECORDER INSERTION: EP1214

## 2016-07-26 LAB — ANTINUCLEAR ANTIBODIES, IFA: ANTINUCLEAR ANTIBODIES, IFA: NEGATIVE

## 2016-07-26 LAB — BETA-2-GLYCOPROTEIN I ABS, IGG/M/A
Beta-2 Glyco I IgG: <9 GPI IgG units (ref 0–20)
Beta-2-Glycoprotein I IgM: <9 GPI IgM units (ref 0–32)

## 2016-07-26 LAB — GLUCOSE, CAPILLARY: GLUCOSE-CAPILLARY: 213 mg/dL — AB (ref 65–99)

## 2016-07-26 LAB — HOMOCYSTEINE: HOMOCYSTEINE-NORM: 9.2 umol/L (ref 0.0–15.0)

## 2016-07-26 LAB — ANTI-DNA ANTIBODY, DOUBLE-STRANDED

## 2016-07-26 SURGERY — LOOP RECORDER INSERTION

## 2016-07-26 SURGERY — ECHOCARDIOGRAM, TRANSESOPHAGEAL
Anesthesia: Moderate Sedation

## 2016-07-26 MED ORDER — MIDAZOLAM HCL 5 MG/ML IJ SOLN
INTRAMUSCULAR | Status: AC
  Filled 2016-07-26: qty 2

## 2016-07-26 MED ORDER — LIDOCAINE VISCOUS 2 % MT SOLN
OROMUCOSAL | Status: DC | PRN
  Administered 2016-07-26: 10 mL via OROMUCOSAL

## 2016-07-26 MED ORDER — LIDOCAINE-EPINEPHRINE 1 %-1:100000 IJ SOLN
INTRAMUSCULAR | Status: AC
  Filled 2016-07-26: qty 1

## 2016-07-26 MED ORDER — METFORMIN HCL 500 MG PO TABS: 500.0000 mg | ORAL_TABLET | Freq: Two times a day (BID) | ORAL | 1 refills | Status: DC

## 2016-07-26 MED ORDER — FENTANYL CITRATE (PF) 100 MCG/2ML IJ SOLN
INTRAMUSCULAR | Status: AC
  Filled 2016-07-26: qty 2

## 2016-07-26 MED ORDER — SODIUM CHLORIDE 0.9 % IV SOLN
INTRAVENOUS | Status: DC
  Administered 2016-07-26: 500 mL via INTRAVENOUS

## 2016-07-26 MED ORDER — MIDAZOLAM HCL 10 MG/2ML IJ SOLN
INTRAMUSCULAR | Status: DC | PRN
  Administered 2016-07-26: 1 mg via INTRAVENOUS
  Administered 2016-07-26 (×2): 2 mg via INTRAVENOUS
  Administered 2016-07-26: 1 mg via INTRAVENOUS

## 2016-07-26 MED ORDER — FENTANYL CITRATE (PF) 100 MCG/2ML IJ SOLN
INTRAMUSCULAR | Status: DC | PRN
  Administered 2016-07-26 (×2): 25 ug via INTRAVENOUS

## 2016-07-26 MED ORDER — MELATONIN 5 MG PO TABS: 5.0000 mg | ORAL_TABLET | Freq: Every evening | ORAL | 1 refills | Status: AC | PRN

## 2016-07-26 MED ORDER — LIDOCAINE HCL (PF) 1 % IJ SOLN
INTRAMUSCULAR | Status: DC | PRN
  Administered 2016-07-26: 20 mL via SUBCUTANEOUS

## 2016-07-26 MED ORDER — LIDOCAINE VISCOUS 2 % MT SOLN
OROMUCOSAL | Status: AC
  Filled 2016-07-26: qty 15

## 2016-07-26 MED ORDER — BUTAMBEN-TETRACAINE-BENZOCAINE 2-2-14 % EX AERO
INHALATION_SPRAY | CUTANEOUS | Status: DC | PRN
  Administered 2016-07-26: 2 via TOPICAL

## 2016-07-26 SURGICAL SUPPLY — 2 items
LOOP REVEAL LINQSYS (Prosthesis & Implant Heart) ×2 IMPLANT
PACK LOOP INSERTION (CUSTOM PROCEDURE TRAY) ×3 IMPLANT

## 2016-07-26 NOTE — Progress Notes (Signed)
Discharge instructions reviewed with patient/family. All questions answered at this time. Transport home by family.   Evie Crumpler, RN 

## 2016-07-26 NOTE — Progress Notes (Signed)
   Subjective:  Patient seen and evaluated at bedside this morning. Feels well and is without complaint. Eager for discharge. Denies any dizziness.   Objective:  Vital signs in last 24 hours: Vitals:   07/26/16 1050 07/26/16 1100 07/26/16 1104 07/26/16 1110  BP: 133/78 133/71 137/72 132/83  Pulse: 87 83 85 81  Resp: 13 15 15 13   Temp:      TempSrc:      SpO2: 95% 94% 97% 96%  Weight:      Height:       General: Well appearing caucasian female, sitting upright in bed, NAD. Pleasant. Cardiac: RRR. No MGR appreciated.  Pulm: CTA BL. Normal WOB on RA.  Ext: Warm, perfused. No pedal edema appreciated. Neuro: Alert, oriented. Strength and sensation grossly intact. No residual cerebellar signs.  Assessment/Plan:  #Acute right lateral cerebellar infarct ##Hx of scattered BL CVAs ###Non-occlusive left mid-distal jugular venous thrombosis & non-occlusive left transverse sinus thrombosis  TEE today without thrombus however did show small PFO. Loop recorder placed after procedure. Hypercoag work-up ordered by neurology still pending. Will plan for discharge today with close follow-up once recovered from procedure.  -ASA + Plavix -Atorvastatin 40mg  -Ramelteon QHS PRN sleep  #Hypertension ##Hypercholesterolemia  -Lisinopril 5 mg daily -Atorvastatin  #Diabetes  -Lantus 22 units QHS -Moderate sliding scale -Hg A1c 8.8  #Seizures -Seizure precautions   Dispo: Anticipated discharge today.   Edwardo Wojnarowski, DO 07/26/2016, 11:20 AM

## 2016-07-26 NOTE — Consult Note (Signed)
ELECTROPHYSIOLOGY CONSULT NOTE  Patient ID: Tonya Sanders MRN: [id number], DOB/AGE: 04/29/1960   Admit date: 07/19/2016 Date of Consult: 07/26/2016  Primary Physician: Dan Maker, MD Primary Cardiologist: new [facility] Reason for Consultation: Cryptogenic stroke; recommendations regarding Implantable Loop Recorder  History of Present Illness Tonya Sanders is a 56 y.o. female whom EP has been asked to see by Dr Roda Shutters to evaluate for ILR placement in the setting of cryptogenic stroke.  She was admitted on 07/19/2016 with GERD, HTN, prior seizures, and prior stroke.   Imaging demonstrated right acute AICA territory infarct with previous right PICA and left PCA infarcts felt to be embolic 2/2 unknown source.  She has undergone workup for stroke including echocardiogram and carotid imaging.  The patient has been monitored on telemetry which has demonstrated sinus rhythm with no arrhythmias.  Inpatient stroke work-up is to be completed with a TEE.   Echocardiogram this admission demonstrated EF 55-60%, mild LVH, grade 1 diastolic dysfunction, LA 41.  Lab work is reviewed.  Prior to admission, the patient denies chest pain, shortness of breath, dizziness, palpitations, or syncope.  They are recovering from their stroke with plans to return home at discharge.  EP has been asked to evaluate for placement of an implantable loop recorder to monitor for atrial fibrillation.   Past Medical History:  Diagnosis Date  . Anxiety   . Arthritis    "all over" (07/19/2016)  . Asthma   . Bipolar disorder (HCC)   . Chronic bronchitis (HCC)   . Chronic lower back pain   . Depression   . GERD (gastroesophageal reflux disease)   . H. pylori infection   . Hyperlipidemia   . Hypertension   . Migraine    "a few/month" (07/19/2016)  . Myocardial infarction (HCC) 2016  . Neuropathy   . Neuropathy    Hattie Perch 07/19/2016  . PUD (peptic ulcer disease)    hx/notes 06/22/2010  . Seizures (HCC)   .  Stroke [facility])    "I've had several"; denies residual (07/19/2016)  . Type II diabetes mellitus (HCC)      Surgical History:  Past Surgical History:  Procedure Laterality Date  . APPENDECTOMY    . CARDIAC CATHETERIZATION  2016  . CHOLECYSTECTOMY OPEN    . ESOPHAGOGASTRODUODENOSCOPY ENDOSCOPY  07/2005   Hattie Perch 06/22/2010  . FLEXIBLE SIGMOIDOSCOPY  07/2005   Hattie Perch 06/22/2010  . TUBAL LIGATION       No prescriptions prior to admission.    Inpatient Medications:  . [MAR Hold]  stroke: mapping our early stages of recovery book   Does not apply Once  . [MAR Hold] aspirin  81 mg Oral Daily  . [MAR Hold] atorvastatin  40 mg Oral q1800  . [MAR Hold] clopidogrel  75 mg Oral Daily  . [MAR Hold] DULoxetine  60 mg Oral BID  . [MAR Hold] enoxaparin (LOVENOX) injection  40 mg Subcutaneous Q24H  . [MAR Hold] feeding supplement (GLUCERNA SHAKE)  237 mL Oral TID BM  . [MAR Hold] insulin aspart  0-15 Units Subcutaneous TID WC  . [MAR Hold] insulin glargine  22 Units Subcutaneous QHS  . [MAR Hold] lisinopril  5 mg Oral Daily    Allergies:  Allergies  Allergen Reactions  . Doxycycline Nausea And Vomiting  . Penicillins Nausea And Vomiting    Has patient had a PCN reaction causing immediate rash, facial/tongue/throat swelling, SOB or lightheadedness with hypotension: No Has patient had a PCN reaction causing severe rash  involving mucus membranes or skin necrosis: No Has patient had a PCN reaction that required hospitalization: No Has patient had a PCN reaction occurring within the last 10 years: No If all of the above answers are "NO", then may proceed with Cephalosporin use.   . Tomato Other (See Comments)    unspecified    Social History   Social History  . Marital status: Divorced    Spouse name: N/A  . Number of children: N/A  . Years of education: N/A   Occupational History  . Not on file.   Social History Main Topics  . Smoking status: Current Some Day Smoker  . Smokeless  tobacco: Never Used     Comment: 07/19/2016 "just smoke when I get upset"  . Alcohol use Yes  . Drug use: No  . Sexual activity: Yes   Other Topics Concern  . Not on file   Social History Narrative  . No narrative on file     Family History: no premature CAD    Review of Systems: All other systems reviewed and are otherwise negative except as noted above.  Physical Exam: Vitals:   07/25/16 2111 07/26/16 0033 07/26/16 0639 07/26/16 0906  BP: 133/80 (!) 143/82 135/67 (!) 153/88  Pulse: 85 (!) 103 78 85  Resp: 18 18 16 13   Temp: 98.9 F (37.2 C) 98.5 F (36.9 C) 98.3 F (36.8 C) 98.4 F (36.9 C)  TempSrc: Oral Oral Oral Oral  SpO2: 99% 98% 100% 93%  Weight:      Height:        GEN- The patient is elderly appearing, alert and oriented x 3 today.   Head- normocephalic, atraumatic Eyes-  Sclera clear, conjunctiva pink Ears- hearing intact Oropharynx- clear, poor dentition Neck- supple Lungs- Clear to ausculation bilaterally, normal work of breathing Heart- Regular rate and rhythm, no murmurs, rubs or gallops  GI- soft, NT, ND, + BS Extremities- no clubbing, cyanosis, or edema MS- no significant deformity or atrophy Skin- no rash or lesion Psych- euthymic mood, full affect   Labs:   Lab Results  Component Value Date   WBC 9.3 07/25/2016   HGB 12.8 07/25/2016   HCT 38.8 07/25/2016   MCV 95.6 07/25/2016   PLT 270 07/25/2016    Recent Labs Lab 07/25/16 0603  NA 138  K 3.9  CL 102  CO2 29  BUN 18  CREATININE 0.65  CALCIUM 9.2  GLUCOSE 233*    Radiology/Studies: Ct Angio Head W Or Wo Contrast Result Date: 07/20/2016 CLINICAL DATA:  Follow-up examination for acute stroke, confirmed on prior MRI. EXAM: CT ANGIOGRAPHY HEAD AND NECK TECHNIQUE: Multidetector CT imaging of the head and neck was performed using the standard protocol during bolus administration of intravenous contrast. Multiplanar CT image reconstructions and MIPs were obtained to evaluate the  vascular anatomy. Carotid stenosis measurements (when applicable) are obtained utilizing NASCET criteria, using the distal internal carotid diameter as the denominator. CONTRAST:  50 cc of Isovue 370. COMPARISON:  Previous MRI and CT from 07/19/2016. FINDINGS: CT HEAD FINDINGS Brain: Evolving acute ischemic infarct involving the lateral right cerebellar hemisphere again seen, stable in distribution relative to previous MRI. No significant mass effect. No evidence for hemorrhagic transformation. Adjacent chronic right cerebellar infarct again noted. Additional chronic left PCA territory infarct present as well. No acute intracranial hemorrhage. No other new acute intracranial infarct. No mass lesion or midline shift. No hydrocephalus. No extra-axial fluid collection. Vascular: No hyperdense vessel. Skull: Scalp and calvarium  within normal limits. Sinuses: Paranasal sinuses and mastoid air cells are clear. Orbits: Globes and orbital soft tissues within normal limits. Review of the MIP images confirms the above findings CTA NECK FINDINGS Aortic arch: Visualized aortic arch of normal caliber and appearance. Aberrant right subclavian artery noted. No high-grade stenosis about the origin of the great vessels. Visualized subclavian arteries widely patent. Right carotid system: Right common and internal carotid artery's widely patent without stenosis, dissection, or occlusion. No significant atheromatous narrowing about the right carotid bifurcation. Left carotid system: Left common and internal carotid artery's widely patent without stenosis, dissection, or occlusion. No significant atheromatous narrowing about the left carotid bifurcation. Vertebral arteries: Both of the vertebral arteries arise from the subclavian arteries. Vertebral arteries patent within the neck without stenosis, dissection, or occlusion. Skeleton: No acute osseous abnormality. No worrisome lytic or blastic osseous lesions. Moderate degenerative  spondylolysis present at C6-7. Other neck: Filling defect within the mid-distal left internal jugular vein, compatible with nonocclusive thrombus (series 11, image 250). No other acute soft tissue abnormality within the neck. Salivary glands normal. No adenopathy. Thyroid normal. Upper chest: Visualized upper chest within normal limits. Review of the MIP images confirms the above findings CTA HEAD FINDINGS Anterior circulation: Petrous segments widely patent bilaterally. Mild scattered atheromatous plaque within the carotid siphons without flow limiting stenosis. ICA termini widely patent. A1 segments patent. Anterior communicating artery normal. Anterior cerebral arteries widely patent to their distal aspects. M1 segments patent without stenosis or occlusion. No proximal M2 occlusion. Distal MCA branches well opacified and symmetric. Posterior circulation: Vertebral artery is widely patent to the vertebrobasilar junction. Posterior inferior cerebral arteries not well evaluated on this exam. Basilar artery widely patent to its distal aspect. Superior cerebral arteries patent bilaterally. Both of the posterior cerebral arteries supplied primarily via the basilar artery. Right PCA widely patent to its distal aspect. Chronic occlusion of the proximal left P 2 segment given the large remote left PCA territory infarct. Venous sinuses: Somewhat elongated irregular filling defect within the distal left transverse sinus, suspicious for possible small amount of nonocclusive thrombus (series 13, image 158). Venous sinuses are otherwise widely patent. Anatomic variants: No significant anatomic variant. No aneurysm or vascular malformation. Delayed phase: No pathologic enhancement. Review of the MIP images confirms the above findings IMPRESSION: CTA NECK IMPRESSION: 1. Negative CTA of the neck. No high-grade or critical stenosis within the major arterial vasculature of the neck. 2. Aberrant right subclavian artery. 3. Filling  defect within the mid-distal left internal jugular vein, compatible with nonocclusive jugular venous thrombosis. CTA HEAD IMPRESSION: 1. Chronic proximal left P2 occlusion. 2. Otherwise negative CTA for large or proximal arterial branch occlusion. No other high-grade or correctable stenosis. 3. Small focal irregular filling defect within the distal left transverse sinus, suspicious for small amount of nonocclusive thrombus given the findings in the neck. Electronically Signed   By: Rise Mu M.D.   On: 07/20/2016 22:19   Mr Brain Wo Contrast Result Date: 07/19/2016 CLINICAL DATA:  Question of new right cerebral infarct. EXAM: MRI HEAD WITHOUT CONTRAST TECHNIQUE: Multiplanar, multiecho pulse sequences of the brain and surrounding structures were obtained without intravenous contrast. COMPARISON:  Head CT from earlier today. FINDINGS: Brain: Moderate volume of restricted diffusion in the lateral right cerebellum, adjacent to a smaller area of remote infarct that was seen by CT. There are 2 foci of diffusion hyperintensity within the remote left PCA territory infarct affecting the occipital lobe. These are in an area of dense  encephalomalacia and are likely chronic/artifactual (there is mineralization based on CT and T1 weighted imaging). No acute hemorrhage. Subcortical gliotic signal along the high frontal parietal convexities, with mild cortex involvement in the posterior frontal lobes. Remote right thalamus lacunar infarct. Normal brain volume. Vascular: Major flow voids are preserved. Skull and upper cervical spine: Negative Sinuses/Orbits: Negative IMPRESSION: 1. Moderate acute infarct in the lateral right cerebellum. 2. Small remote right cerebellar infarct adjacent to #1. 3. Remote left PCA territory infarct affecting the occipital lobe. Superimposed foci of diffusion hyperintensity are likely chronic/artifactual. 4. Symmetric mild subcortical and cortical gliosis along the frontoparietal  convexities, possible old watershed infarcts. Electronically Signed   By: Marnee Spring M.D.   On: 07/19/2016 12:38    12-lead ECG sinus rhythm All prior EKG's in EPIC reviewed with no documented atrial fibrillation  Telemetry sinus rhythm  Assessment and Plan:  1. Cryptogenic stroke The patient presents with cryptogenic stroke.  The patient has a TEE planned for this AM.  I spoke at length with the patient about monitoring for afib with an implantable loop recorder.  Risks, benefits, and alteratives to implantable loop recorder were discussed with the patient today.   At this time, the patient is very clear in their decision to proceed with implantable loop recorder.   Wound care was reviewed with the patient (keep incision clean and dry for 3 days).  Please call with questions.   Gypsy Balsam, NP 07/26/2016 9:20 AM  EP Attending Patient seen and examined. Agree with above.The findings documented above represent my findings as well.  The patient presents with unexplained stroke. She has undergone TEE with no explanation for her stroke. I have reviewed the indications for insertion of an ILR and she wishes to proceed.  Leonia Reeves.D.

## 2016-07-26 NOTE — Progress Notes (Signed)
Inpatient Diabetes Program Recommendations  AACE/ADA: New Consensus Statement on Inpatient Glycemic Control (2015)  Target Ranges:  Prepandial:   less than 140 mg/dL      Peak postprandial:   less than 180 mg/dL (1-2 hours)      Critically ill patients:  140 - 180 mg/dL   Results for SAJA, BALAGUER (MRN [id number]) as of 07/26/2016 12:04  Ref. Range 07/25/2016 06:27 07/25/2016 11:22 07/25/2016 16:51 07/25/2016 21:26  Glucose-Capillary Latest Ref Range: 65 - 99 mg/dL 774 (H) 142 (H) 395 (H) 291 (H)    Home DM Meds: Metformin 500 mg BID (see PCP note through Care Everywhere from 05/22/16)  Current Insulin Orders: Lantus 22 units QHS      Novolog Moderate Correction Scale/ SSI (0-15 units) TID AC       MD- Note patient saw PCP on 05/22/16 (Dr. Loistine Chance with [facility]).  At that visit, pt was instructed to take Metformin 500 mg BID.  PCP discussed with patient the possibility of starting Victoza as outpatient as well.  CBGs still elevated here in hospital.    Please consider the following adjustments:  1. Increase Lantus to 25 units QHS  2. Start Novolog Meal Coverage: Novolog 4 units TID with meals (hold if pt eats <50% of meal)      --Will follow patient during hospitalization--  Ambrose Finland RN, MSN, CDE Diabetes Coordinator Inpatient Glycemic Control Team Team Pager: [phone] (8a-5p)

## 2016-07-26 NOTE — CV Procedure (Signed)
TRANSESOPHAGEAL ECHOCARDIOGRAM (TEE) NOTE  INDICATIONS: stroke  PROCEDURE:   Informed consent was obtained prior to the procedure. The risks, benefits and alternatives for the procedure were discussed and the patient comprehended these risks.  Risks include, but are not limited to, cough, sore throat, vomiting, nausea, somnolence, esophageal and stomach trauma or perforation, bleeding, low blood pressure, aspiration, pneumonia, infection, trauma to the teeth and death.    After a procedural time-out, the patient was given 6 mg versed and 50 mcg fentanyl for moderate sedation.  The patient's heart rate, blood pressure, and oxygen saturation are monitored continuously during the procedure.The oropharynx was anesthetized 10 cc of topical 1% viscous lidocaine and 2 cetacaine sprays.  The transesophageal probe was inserted in the esophagus and stomach without difficulty and multiple views were obtained.  The patient was kept under observation until the patient left the procedure room.  The period of conscious sedation is 21 minutes, of which I was present face-to-face 100% of this time. The patient left the procedure room in stable condition.   Agitated microbubble saline contrast was administered.  COMPLICATIONS:    There were no immediate complications.  Findings:  1. LEFT VENTRICLE: The left ventricular wall thickness is mildly increased with moderate assymetric septal hypertrophy.  The left ventricular cavity is normal in size. Wall motion is normal.  LVEF is 60-65%.  2. RIGHT VENTRICLE:  The right ventricle is normal in structure and function without any thrombus or masses.    3. LEFT ATRIUM:  The left atrium is mildly dilated in size without any thrombus or masses.  There is spontaneous echo contrast ("smoke") in the left atrium consistent with a low flow state.  4. LEFT ATRIAL APPENDAGE:  The left atrial appendage is free of any thrombus or masses. The appendage has single lobes and is  moderate sized, windsock shape. Pulse doppler indicates high flow in the appendage.  5. ATRIAL SEPTUM:  The atrial septum is aneurysmal. There is evidence for a small amount of brisk interatrial shunting from right to left by saline microbubble, but this was not apparent by color doppler.   6. RIGHT ATRIUM:  The right atrium is normal in size and function without any thrombus or masses.  7. MITRAL VALVE:  The mitral valve is normal in structure and function with trivial regurgitation.  There were no vegetations or stenosis.  8. AORTIC VALVE:  The aortic valve is trileaflet, normal in structure and function with trivial regurgitation.  There were no vegetations or stenosis  9. TRICUSPID VALVE:  The tricuspid valve is normal in structure and function with trivial regurgitation.  There were no vegetations or stenosis  10.  PULMONIC VALVE:  The pulmonic valve is normal in structure and function with no regurgitation.  There were no vegetations or stenosis.   11. AORTIC ARCH, ASCENDING AND DESCENDING AORTA:  There was no atherosclerosis of the ascending aorta, aortic arch, or proximal descending aorta.  12. PULMONARY VEINS: Anomalous pulmonary venous return was not noted.  13. PERICARDIUM: The pericardium appeared normal and non-thickened.  There is no pericardial effusion.  IMPRESSION:   1. Interatrial septal aneurysm with small PFO and brisk, unprovoked right to left shunting of saline microbubble contrast 2. No LAA thrombus 3. Mild LVH with moderate LV septal hypertrophy 4. LVEF 60-65%, normal wall motion  RECOMMENDATIONS:    1. There is a small PFO with brisk right to left shunting which is intermittent, related to interatrial septal aneurysm. This could be implicated  in stroke, considering thrombus was found in the jugular vein. Left atrial spontaneous echo contrast was noted - no LAA thrombus, may still consider implanted loop recorder per EP.  Time Spent Directly with the  Patient:  30 minutes   Chrystie Nose, MD, [facility]  [place]  [facility]  Attending Cardiologist  Direct Dial: [phone]  Fax: [phone]  Website:  [email or web address] Nicely Stryker Veasey 07/26/2016, 10:38 AM

## 2016-07-26 NOTE — Care Management Note (Signed)
Case Management Note  Patient Details  Name: Tonya Sanders MRN: [id number] Date of Birth: 1960-02-19  Subjective/Objective:                    Action/Plan: Pt discharging home with self care. Outpatient set up last week. Pt has equipment for home. Husband to provide transportation home.   Expected Discharge Date:  07/26/16               Expected Discharge Plan:  OP Rehab  In-House Referral:     Discharge planning Services  CM Consult  Post Acute Care Choice:  Durable Medical Equipment Choice offered to:  Patient  DME Arranged:  Gilmer Mor, Shower stool DME Agency:  [facility]  HH Arranged:    [facility] Agency:     Status of Service:  Completed, signed off  If discussed at Long Length of Stay Meetings, dates discussed:    Additional Comments:  Kermit Balo, RN 07/26/2016, 2:24 PM

## 2016-07-26 NOTE — H&P (Signed)
   INTERVAL PROCEDURE H&P  History and Physical Interval Note:  07/26/2016 9:40 AM  Tonya Sanders has presented today for their planned procedure. The various methods of treatment have been discussed with the patient and family. After consideration of risks, benefits and other options for treatment, the patient has consented to the procedure.  The patients' outpatient history has been reviewed, patient examined, and no change in status from most recent office note within the past 30 days. I have reviewed the patients' chart and labs and will proceed as planned. Questions were answered to the patient's satisfaction.   Chrystie Nose, MD, [facility]  [place]  [facility]  Attending Cardiologist  Direct Dial: [phone]  Fax: [phone]  Website:  [email or web address] Tonya Sanders 07/26/2016, 9:40 AM

## 2016-07-26 NOTE — Progress Notes (Signed)
STROKE TEAM PROGRESS NOTE  SUBJECTIVE (INTERVAL HISTORY) Husband is at bedside. Had TEE showed PFO with ASA.   OBJECTIVE Temp:  [98 F (36.7 C)-98.9 F (37.2 C)] 98 F (36.7 C) (06/20 1300) Pulse Rate:  [78-103] 97 (06/20 1300) Cardiac Rhythm: Normal sinus rhythm (06/20 0704) Resp:  [12-18] 16 (06/20 1300) BP: (93-167)/(67-92) 127/69 (06/20 1300) SpO2:  [93 %-100 %] 99 % (06/20 1300)  CBC:   Recent Labs Lab 07/20/16 0420 07/25/16 0603  WBC 8.1 9.3  HGB 13.2 12.8  HCT 39.8 38.8  MCV 94.5 95.6  PLT 311 270    Basic Metabolic Panel:   Recent Labs Lab 07/25/16 0603  NA 138  K 3.9  CL 102  CO2 29  GLUCOSE 233*  BUN 18  CREATININE 0.65  CALCIUM 9.2    Lipid Panel:     Component Value Date/Time   CHOL 155 07/20/2016 0420   TRIG 250 (H) 07/20/2016 0420   HDL 28 (L) 07/20/2016 0420   CHOLHDL 5.5 07/20/2016 0420   VLDL 50 (H) 07/20/2016 0420   LDLCALC 77 07/20/2016 0420   HgbA1c:  Lab Results  Component Value Date   HGBA1C 8.8 (H) 07/20/2016   Urine Drug Screen: No results found for: LABOPIA, COCAINSCRNUR, LABBENZ, AMPHETMU, THCU, LABBARB  Alcohol Level No results found for: [facility]  IMAGING I have personally reviewed the radiological images below and agree with the radiology interpretations. Red text is my interpretation  Dg Chest 2 View 07/19/2016 Low lung volumes. Perhaps mild interstitial thickening/edema. No evidence of pneumonia or overt alveolar pulmonary edema.   Ct Head Wo Contrast 07/19/2016 1. Age-indeterminate small right cerebellar infarct. Provided history suggests recent infarct, suggest MR correlation.  2. Age-indeterminate right thalamus lacunar infarct.  3. Remote left PCA distribution infarct, new from 2008.   Mr Brain Wo Contrast 07/19/2016  1. Moderate acute infarct in the lateral right cerebellum.  2. Small remote right cerebellar infarct adjacent to #1.  3. Remote left PCA territory infarct affecting the occipital lobe. Superimposed  foci of diffusion hyperintensity are likely chronic/artifactual.  4. Symmetric mild subcortical and cortical gliosis along the frontoparietal convexities, possible old watershed infarcts.   Ct Angio Head W Or Wo Contrast Ct Angio Neck W Or Wo Contrast 07/20/2016  CTA NECK  1. Negative CTA of the neck. No high-grade or critical stenosis within the major arterial vasculature of the neck.  2. Aberrant right subclavian artery.  3. Filling defect within the mid-distal left internal jugular vein, compatible with nonocclusive jugular venous thrombosis. However, on my review of the left IJ, it seems like to be contrast in the left IJ instead of thrombus.  CTA HEAD 1. Chronic proximal left P2 occlusion.  2. Otherwise negative CTA for large or proximal arterial branch occlusion. No other high-grade or correctable stenosis.  3. Small focal irregular filling defect within the distal left transverse sinus, suspicious for small amount of nonocclusive thrombus given the findings in the neck.   TEE 1. Interatrial septal aneurysm with small PFO and brisk, unprovoked right to left shunting of saline microbubble contrast 2. No LAA thrombus 3. Mild LVH with moderate LV septal hypertrophy 4. LVEF 60-65%, normal wall motion  LE venous Doppler No evidence of deep vein thrombosis in the visualized veins, negative for baker's cysts bilaterally.   PHYSICAL EXAM  Temp:  [98 F (36.7 C)-98.9 F (37.2 C)] 98 F (36.7 C) (06/20 1300) Pulse Rate:  [78-103] 97 (06/20 1300) Resp:  [12-18] 16 (06/20 1300)  BP: (93-167)/(67-92) 127/69 (06/20 1300) SpO2:  [93 %-100 %] 99 % (06/20 1300)  General - obese, well developed, in no apparent distress.  Ophthalmologic - Fundi not visualized due to noncooperation.  Cardiovascular - Regular rate and rhythm.  Mental Status -  Level of arousal and orientation to time, place, and person were intact. Language including expression, naming, repetition, comprehension was  assessed and found intact. Fund of Knowledge was assessed and was intact.  Cranial Nerves II - XII - II - right incomplete homonymous hemianopia. III, IV, VI - Extraocular movements intact. V - Facial sensation intact bilaterally. VII - Facial movement intact bilaterally. VIII - Hearing & vestibular intact bilaterally. X - Palate elevates symmetrically, poor denture without teeth. XI - Chin turning & shoulder shrug intact bilaterally. XII - Tongue protrusion intact.  Motor Strength - The patient's strength was normal in all extremities and pronator drift was absent.  Bulk was normal and fasciculations were absent.   Motor Tone - Muscle tone was assessed at the neck and appendages and was normal.  Reflexes - The patient's reflexes were 1+ in all extremities and she had no pathological reflexes.  Sensory - Light touch, temperature/pinprick were assessed and were symmetrical.    Coordination - The patient had normal movements in the hands with no ataxia or dysmetria.  Tremor was absent.  Gait and Station - deferred.   ASSESSMENT/PLAN Ms. TRELLA THURMOND is a 56 y.o. female with history of arthritis, GERD, HLD, HTN, neuropathy, seizures, DM not compliant with medication and prior stroke presenting with nausea and vomiting, HA and dizziness. She did not receive IV t-PA due to delay in arrival.   Stroke:   R acute AICA territory infarct with previous right PICA and left PCA infarcts concerning for embolic etiology, source unclear.   CT head small R cerebellar infarct, old R thalamic lacune. Old L PCA infarct since 2008.  MRI head lateral R cerebellar infarct. Old R cerebellar infarct. Old L PCA infarct.   CTA head and neck unremarkable except left P2 occlusion  2D Echo  EF 55-60%  TEE showed PFO with ASA, no thrombus  Loop recorder placed   LE venous Doppler no DVT  Hypercoag workup pending.  LDL 77  HgbA1c 8.8  UDS pending  Lovenox 40 mg sq daily for VTE  prophylaxis Diet Carb Modified Fluid consistency: Thin; Room service appropriate? Yes  Noncompliance with medication, now on aspirin 81 mg daily and clopidogrel 75 mg daily. Continue DAPT on discharge.   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  Staring spells but able to hear and feel, just not able to respond  Related to anxiety and depression as per patient  No spells if remains active and busy as per patient  Hypertension  Stable Permissive hypertension (OK if < 220/120) but gradually normalize in 5-7 days Long-term BP goal normotensive  Hyperlipidemia  Now on lipitor 40mg  daily  LDL 77, goal <  70  Continue statin at discharge  Diabetes type II  Hyperglycemia  HgbA1c 8.8, goal < 7.0  Uncontrolled  On Lantus  SSI  Other Stroke Risk Factors  Former Cigarette smoker  ETOH use, advised to drink no more than 1 drink(s) a day  Obesity, Body mass index is 38.86 kg/m., recommend weight loss, diet and exercise as appropriate   Other Active Problems  GERD  Hospital day # 7  Neurology will sign off. Please call with questions. Pt will follow up with Darrol Angel NP at [facility] in about 6 weeks. Thanks for the consult.   Marvel Plan, MD PhD Stroke Neurology 07/26/2016 3:46 PM

## 2016-07-26 NOTE — Progress Notes (Signed)
Patient arrived back to room from procedure. Denied any pain or discomfort. VS stable. Dressing intact. Patient request to go home. MD paged. No other distress noted. Will continue to monitor.   Sim Boast, RN

## 2016-07-27 LAB — CARDIOLIPIN ANTIBODIES, IGG, IGM, IGA: Anticardiolipin IgM: <9 MPL U/mL (ref 0–12)

## 2016-07-27 NOTE — Telephone Encounter (Signed)
No answer, no vmail 

## 2016-07-27 NOTE — Discharge Summary (Signed)
Name: Tonya Sanders MRN: [id number] DOB: 03-Sep-1960 56 y.o. PCP: Dan Maker, MD  Date of Admission: 07/19/2016  6:49 AM Date of Discharge: 07/26/2016 Attending Physician: Dr. Cephas Darby, MD  Discharge Diagnosis: 1. Acute cerebellar stroke, history of prior stroke 2. Type 2 diabetes 3. Hypertension 4. Hyperlipidemia  Principal Problem:   Cerebellar stroke [facility]) Active Problems:   CVA (cerebral vascular accident) (HCC)   Hypertension   Hyperlipidemia   Type II diabetes mellitus (HCC)   Seizures (HCC)   Discharge Medications: Allergies as of 07/26/2016      Reactions   Doxycycline Nausea And Vomiting   Penicillins Nausea And Vomiting   Has patient had a PCN reaction causing immediate rash, facial/tongue/throat swelling, SOB or lightheadedness with hypotension: No Has patient had a PCN reaction causing severe rash involving mucus membranes or skin necrosis: No Has patient had a PCN reaction that required hospitalization: No Has patient had a PCN reaction occurring within the last 10 years: No If all of the above answers are "NO", then may proceed with Cephalosporin use.   Tomato Other (See Comments)   unspecified      Medication List    TAKE these medications   aspirin 81 MG chewable tablet Chew 1 tablet (81 mg total) by mouth daily.   atorvastatin 40 MG tablet Commonly known as:  LIPITOR Take 1 tablet (40 mg total) by mouth daily at 6 PM.   clopidogrel 75 MG tablet Commonly known as:  PLAVIX Take 1 tablet (75 mg total) by mouth daily.   DULoxetine 60 MG capsule Commonly known as:  CYMBALTA Take 1 capsule (60 mg total) by mouth 2 (two) times daily.   lisinopril 5 MG tablet Commonly known as:  PRINIVIL,ZESTRIL Take 1 tablet (5 mg total) by mouth daily.   Melatonin 5 MG Tabs Take 1 tablet (5 mg total) by mouth at bedtime as needed (insomnia).   metFORMIN 500 MG tablet Commonly known as:  GLUCOPHAGE Take 1 tablet (500 mg total) by mouth 2  (two) times daily with a meal.       Disposition and follow-up:   Ms.Tonya Sanders was discharged from [facility] in Stable condition.  At the hospital follow up visit please address:  1.  Cerebellar infarction: Ensure compliance with dual antiplatelet therapy, statin, lisinopril and that deficits remain improved. Ensure patient is working with physical therapy at home. Type 2 diabetes: Hemoglobin A1c 8.8% during admission. Was discharged home with metformin 500 mg twice a day. Consider adding additional oral agents.  2.  Labs / imaging needed at time of follow-up: Appointment to assess renal function as ACE inhibitor was recently started  3.  Pending labs/ test needing follow-up: None  Follow-up Appointments: Follow-up Information    Dan Maker, MD. Schedule an appointment as soon as possible for a visit in 1 week(s).   Specialty:  Family Medicine Contact information: [address] [place] [address] [phone]        [facility] Follow up.   Specialty:  Rehabilitation Why:  they will contact you for the first visit Contact information: [address] [id number] mc [place] [address] [phone]       [facility] [address] [facility] Follow up on 08/03/2016.   Specialty:  Cardiology Why:  at 4:30PM for wound check  Contact information: [address] [place] [address] [phone]       [facility]  HOSPITAL ECHO LAB Follow up.   Specialty:  Cardiology Contact information: [address] [id number] mc [address] [place] [address] [phone]       Nilda Riggs, NP. Schedule an appointment as soon as possible for a visit in 6 week(s).   Specialty:  Family Medicine Contact information: [address] [place] [address] [phone]           Hospital Course by problem  list: Principal Problem:   Cerebellar stroke [facility]) Active Problems:   CVA (cerebral vascular accident) (HCC)   Hypertension   Hyperlipidemia   Type II diabetes mellitus (HCC)   Seizures (HCC)   1. Acute Cerebellar stroke, history of prior CVAs This is a 56 year old female who presented 07/19/16 with 1 day of dizziness, headache, nausea and vomiting. Symptoms resolved after several days of hospitalization. She does have remote history of left PCA CVA as well as several others per patient. She has been noncompliant with her medications, including prescribed dual antiplatelets, for some time. Initial CT head with small right cerebellar infarction, age indeterminate right thalamic lacunar infarct and remote left PCA infarct. MR confirming acute infarction of lateral right cerebellum and confirmed other areas of old bilateral infarctions. Neurology was consulted in stroke workup was performed including TTE showed LVEF of 55-6% with grade 1 diastolic dysfunction and TEE which showed a small PFO. She was placed on Plavix and aspirin as well as received aggressive risk factor modification as outlined below. Fiscal therapy was ordered on discharge. Hypercoagulability workup was ordered by neurology which resulted normally.  #Type 2 diabetes Hemoglobin A1c 8.8%. She was previously prescribed metformin 500 mg twice daily. During hospitalization her blood sugars were controlled with insulin and she was discharged home with her previously prescribed metformin with close follow-up with her PCP for additional glycemic control.  #Hyperlipidemia LDL 77 on admission, goal less than 70. Started on atorvastatin 40 mg which was prescribed on discharge.  #Hypertension Hypertensive on presentation which improved with lisinopril 5 mg after several days of permissive hypertension. At time of discharge her blood pressure was controlled on this regimen.  Discharge Vitals:   BP 127/69 (BP Location: Left Arm)   Pulse 97    Temp 98 F (36.7 C) (Oral)   Resp 16   Ht 5\' 4"  (1.626 m)   Wt 226 lb 6.4 oz (102.7 kg)   SpO2 99%   BMI 38.86 kg/m   Pertinent Labs, Studies, and Procedures:  CT without contrast: Age indeterminate small right cerebellar infarct. H&H determinant right thalamic lacunar infarct. Remote left PCA infarct MRI brain: Moderate acute infarction in the right lateral cerebellum. Small remote right cerebellar infarct adjacent to this. Remote left PCA territory infarction. CTA neck: Chronic proximal left P2 occlusion. Small nonocclusive thrombus and distal left transverse sinus. Hypercoagulable workup: Normal ANA anti-DNA antibody, homocystine, cardiolipin antibody, beta-2 glycoprotein LDL 77 Hemoglobin A1c 8.8%  Discharge Instructions: Discharge Instructions    Ambulatory referral to Neurology    Complete by:  As directed    Follow up with stroke clinic Darrol Angel preferred, if not available, then consider Sylvie Farrier, [facility] or Lucia Gaskins whoever is available) at [facility] in about 6-8 weeks. Thanks.   Ambulatory referral to Occupational Therapy    Complete by:  As directed    Ambulatory referral to Physical Therapy    Complete by:  As directed      Signed: Monseratt Ledin, DO 07/27/2016, 9:51 AM   Pager: [phone]

## 2016-07-31 ENCOUNTER — Other Ambulatory Visit: Payer: Self-pay

## 2016-07-31 NOTE — Patient Outreach (Signed)
[facility]) Care Management  07/31/2016  Tonya Sanders March 02, 1960 [id number]  EMMI:  Referral date: 07/31/16 Referral source: EMMI stroke red alert Referral reason:  Scheduled follow up appointment; NO,  Filled new prescription: NO Day # 1 for  Stroke follow up and medication  Telephone call to patient. Contact answering phone states she is patients daughter. States patient is sleeping now. HIPAA compliant message left with call back phone number given.   PLAN: RNCM will await return call from patient/ If  No return call will attempt 2nd telephone outreach within 3 business days.  [name] RN,BSN,CCM [facility] Telephonic  [phone]

## 2016-08-01 ENCOUNTER — Other Ambulatory Visit: Payer: Self-pay

## 2016-08-01 NOTE — Telephone Encounter (Signed)
No answer

## 2016-08-01 NOTE — Patient Outreach (Addendum)
[facility]) Care Management  08/01/2016  Tonya Sanders [birth year] [id number]  Telephone call to patients daughter, Lurene Shadow.  HIPAA verified with daughter for patient. RNCM informed daughter of patients appointment for Outpatient rehab facility on 08/11/16 and primary MD appointment for 08/02/16. Address and contact number given to daughter for Outpatient rehab center. Advised daughter to call rehab facility or primary MD office if unable to keep appointments and need for rescheduling.   PLAN:  Follow up with patient at next scheduled outcome.   [name] RN,BSN,CCM [facility] Telephonic  [phone]

## 2016-08-01 NOTE — Patient Outreach (Signed)
[facility]) Care Management  08/01/2016  Tonya Sanders 03/23/1960 [id number]  EMMI:  Referral date: 07/31/16 Referral source: EMMI stroke red alert Referral reason:  Scheduled follow up appointment; NO,  Filled new prescription: NO Day # 1 for  Stroke follow up and medication  SUBJECTIVE: Telephone call to patient regarding EMMI stroke red alert. HIPAA verified with patient. Patient request RNCM speak with her daughter Lurene Shadow regarding any of her health information.  Daughter states patient has an appointment scheduled with a doctor on 08/03/16 at 4:30pm.  Daughter unsure who this appointment is with. RNCM able to inform daughter that patients appointment on 08/03/16 is for a wound check due to patients recent loop recorder procedure.  Daughter states patient has recently moved to [place] from [place]. States patients [facility] [address] in [place]. Daughter states patient only has her insulin Levemir and Humulog and her seizure medication. Daughter states she was told patient's other medication would not be in for 3 weeks in pill packs. Daughter states she has just taken over assisting patient with her health care and is not very familiar with all of her medication. RNCM advised patient to call the pharmacy in [place] to request list of medications and to have medications filled until pill packs are mailed. Daughter seemed to express some confusion regarding patients medications. RNCM offered to refer patient to [facility] pharmacist to follow up on her medications. Daughter verbally agreed to referral.  Daughter states patients blood sugars are low frequently. Daughter states patients blood sugars will range in the 40's to 50's at times. Daughter states patient checks her blood sugars at least 3-4 times per day. Daughter states patient takes 50 units of levemir in the morning and 50 units in the evening. Daughter states  patient takes the Humulog insulin on a sliding scale.   Daughter states patient is looking for a new primary MD in [place].  Daughter states patient has a doctor in [place].  RNCM advised daughter to schedule post hospital discharge follow up with patients primary MD in [place] until patient is able to find a new doctor in [place]. Daughter verbalized understanding.  Daughter states patient is suppose to have therapy but no one has called. RNCM reviewed patients discharge instructions. Discussed with daughter that discharge instructions states patient will be called by the outpatient rehab center. RNCM volunteered to call the outpatient center to find out information regarding patients appointment. RNCM reviewed signs/ symptoms of stroke with daughter for patient. Advised to call 911 for stroke like symptoms.  RNCM advised patient to notify MD of any changes in condition prior to scheduled appointment. RNCM provided contact name and number: [phone] [facility] verified patient aware of 911 services for urgent/ emergent needs.  CARE COORDINATION:  RNCM called [facility] and left voice mail message with Misty Stanley. Requested return call to assist patient with case management follow up for diabetic education and management.  RNCM called the Outpatient rehab center. RNCM spoke with Misty Stanley and requested appointment date for patient. Appointment set up by Misty Stanley for patient on 08/11/16 at 11:00am. Patient to arrive at 10:45am.   RNCM attempted to call patient back time 2 to inform her of her therapy appointment. HIPAA compliant voice message left with call back phone number. RNCM called patients primary MD office. RNCM spoke with Tammy who was able to schedule patient a post hospital discharge appointment on tomorrow 6/ 27/18 at 2:20pm  RNCM has attempted to  contact patient back to notify of outpatient therapy and primary MD appointment. Three calls were attempted. HIPAA compliant voice  message left with call back phone number. Requested return call.   ASSESSMENT:   Date of Admission: 07/19/2016  6:49 AM Date of Discharge: 07/26/2016  Discharge Diagnosis: 1. Acute cerebellar stroke, history of prior stroke 2. Type 2 diabetes 3. Hypertension 4. Hyperlipidemia  Per patients discharge instructions:  Hemoglobin A1c 8.8% during admission. Was discharged home with metformin 500 mg twice a day. Consider adding additional oral agents Patients daughter currently says patient is taking insulin.   Medication discrepancy has been referred to [facility] pharmacist.   PLAN:  RNCM will follow up with patient within 3  Business days.  RNCM will attempt return call to [facility] for patient referral.  RNCM will refer patient to [facility] Pharmacist for medication follow up.  RNCM will called outpatient rehab center and patients primary MD office for appointments.   [name] RN,BSN,CCM [facility] Telephonic  [phone]

## 2016-08-02 ENCOUNTER — Ambulatory Visit: Payer: Medicaid Other

## 2016-08-03 ENCOUNTER — Ambulatory Visit: Payer: Medicaid Other

## 2016-08-03 ENCOUNTER — Encounter: Payer: Self-pay | Admitting: Family Medicine

## 2016-08-04 ENCOUNTER — Telehealth: Payer: Self-pay | Admitting: Pharmacist

## 2016-08-04 ENCOUNTER — Encounter: Payer: Self-pay | Admitting: Nurse Practitioner

## 2016-08-04 ENCOUNTER — Other Ambulatory Visit: Payer: Self-pay

## 2016-08-04 NOTE — Patient Outreach (Addendum)
[facility]) Care Management  08/04/2016  Tonya Sanders Aug 13, 1960 [id number]   Patient was called regarding medication management per referral from [facility] Telephonic Nurse, [name].  Patient did not answer the phone.  HIPAA compliant message left on voicemail.  Patient receives care from [place] and [place].   Plan:  Call patient back in 5-7 business days.  [name], PharmD, BCACP [facility] Clinical Pharmacist [phone]

## 2016-08-04 NOTE — Patient Outreach (Signed)
[facility]) Care Management  08/04/2016  ADLYNN LARIOS 12-09-1960 [id number]   EMMI: Stroke Referral date: 07/31/16 Referral source: EMMI stroke red alert  Return call to patients daughter, Lurene Shadow. Unable to reach. HIPAA compliant voice message left with call back phone number.  PLAN; RNCM will attempt 2nd telephone outreach to patients daughter within 3 business days.   [name] RN,BSN,CCM [facility] Telephonic  [phone]

## 2016-08-08 ENCOUNTER — Other Ambulatory Visit: Payer: Self-pay

## 2016-08-08 NOTE — Patient Outreach (Signed)
[facility]) Care Management  08/08/2016  Tonya Sanders [birth year] [id number]  EMMI: Stroke Referral date: 07/31/16 Referral source: EMMI stroke red alert Referral reason: EMMI stroke follow up   Telephone call to patient's daughter regarding EMMI stroke follow up.  Unable to reach patient on listed home number. Message states phone number has calling restrictions. Unable to leave voice mail. Attempted emergency contact phone number. Contact answer phone states patient's daughter is not at home. HIPAA compliant message left with call back phone number.   PLAN; RNCM will attempt 2nd telephone outreach within 1 week.   [name] RN,BSN,CCM [facility] Telephonic  [phone]

## 2016-08-11 ENCOUNTER — Other Ambulatory Visit: Payer: Self-pay

## 2016-08-11 ENCOUNTER — Ambulatory Visit: Payer: Medicaid Other | Admitting: Physical Therapy

## 2016-08-11 ENCOUNTER — Ambulatory Visit
Payer: Medicaid Other | Attending: Student in an Organized Health Care Education/Training Program | Admitting: Occupational Therapy

## 2016-08-11 NOTE — Progress Notes (Signed)
This encounter was created in error - please disregard.

## 2016-08-11 NOTE — Patient Outreach (Signed)
[facility]) Care Management  08/11/2016  Tonya Sanders 05-10-1960 [id number]   EMMI:  Referral date: 07/31/16 Referral source: EMMI stroke red alert Referral reason:  EMMI stroke follow up  Attempt #3  Third telephone call to patient for EMMI  Stroke follow up. Unable to reach patient/ daughter  HIPAA compliant message left with call back phone number given.   PLAN: RNCM will close patient due to 3 telephone outreach attempts.   [name] RN,BSN,CCM [facility] Telephonic  [phone]

## 2016-08-14 ENCOUNTER — Other Ambulatory Visit: Payer: Self-pay | Admitting: Pharmacist

## 2016-08-14 ENCOUNTER — Telehealth: Payer: Self-pay | Admitting: Emergency Medicine

## 2016-08-14 NOTE — Telephone Encounter (Signed)
    Patient says that she has a "camera in her chest that is hurting onside her body" and that the recording mechanism is not working. She scans her camera and it does not read.  Reviewing her note, she had a loop recorder placed. Sounds like it is irritating her and mildly erythematous. She is not having any fluid drainage, heat, or ulceration. The machine is not scanning.  We reviewed emergency signs and symptoms that would warrant a visit to the ER. It doesn't sound like she has any emergency conditions currently, but has been advised to go to the ER if she becomes more concerned.  The plan is for her to call the office tomorrow, to bring in her equipment, it may need to troubleshoot or replaced. I also want to have her loop recorder incision looked at.

## 2016-08-14 NOTE — Patient Outreach (Signed)
[facility]) Care Management  08/14/2016  Tonya Sanders [birth year] [id number]   Patient was called regarding referral for medication management and assistance. No answer and no availability to leave a message.  Phone did not ring and a message was given that said the phone had calling restrictions.  Plan:  I will call patient back in 3-5 business days.   [name], PharmD, BCACP [facility] Clinical Pharmacist [phone]

## 2016-08-17 ENCOUNTER — Other Ambulatory Visit: Payer: Self-pay | Admitting: Pharmacist

## 2016-08-17 NOTE — Patient Outreach (Signed)
[facility]) Care Management  08/17/2016  Tonya Sanders [birth year] [id number]   Patient was called regarding referral for medication management and assistance. No answer and no availability to leave a message.  Phone did not ring and a message was given that said the phone had calling restrictions.  Plan:  Send patient an "unsuccessful attempt letter" If I do not hear from the patient in 10 business days, close the case.   [name], PharmD, BCACP [facility] Clinical Pharmacist [phone]

## 2016-08-25 ENCOUNTER — Ambulatory Visit (INDEPENDENT_AMBULATORY_CARE_PROVIDER_SITE_OTHER): Payer: Medicare Other | Admitting: *Deleted

## 2016-08-25 DIAGNOSIS — I639 Cerebral infarction, unspecified: Secondary | ICD-10-CM

## 2016-08-29 NOTE — Progress Notes (Signed)
Carelink Summary Report / Loop Recorder 

## 2016-08-31 ENCOUNTER — Telehealth: Payer: Self-pay | Admitting: *Deleted

## 2016-08-31 ENCOUNTER — Ambulatory Visit: Payer: Medicaid Other | Admitting: Neurology

## 2016-08-31 ENCOUNTER — Ambulatory Visit: Payer: Medicaid Other

## 2016-08-31 NOTE — Telephone Encounter (Signed)
No showed new patient appointment. 

## 2016-09-01 ENCOUNTER — Encounter: Payer: Self-pay | Admitting: Internal Medicine

## 2016-09-04 ENCOUNTER — Encounter: Payer: Self-pay | Admitting: Neurology

## 2016-09-04 ENCOUNTER — Other Ambulatory Visit: Payer: Self-pay | Admitting: Pharmacist

## 2016-09-14 LAB — CUP PACEART REMOTE DEVICE CHECK
Date Time Interrogation Session: 20180720223830
MDC IDC PG IMPLANT DT: 20180620

## 2016-09-25 ENCOUNTER — Ambulatory Visit (INDEPENDENT_AMBULATORY_CARE_PROVIDER_SITE_OTHER): Payer: Medicare Other | Admitting: *Deleted

## 2016-09-25 DIAGNOSIS — I639 Cerebral infarction, unspecified: Secondary | ICD-10-CM | POA: Diagnosis not present

## 2016-09-27 NOTE — Progress Notes (Signed)
Carelink Summary Report / Loop Recorder 

## 2016-09-30 LAB — CUP PACEART REMOTE DEVICE CHECK
Implantable Pulse Generator Implant Date: 20180620
MDC IDC SESS DTM: 20180819233738

## 2016-09-30 NOTE — Progress Notes (Signed)
Carelink summary report received. Battery status OK. Normal device function. No new symptom episodes, tachy episodes, brady, or pause episodes. No new AF episodes. Monthly summary reports and ROV/PRN 

## 2016-10-24 ENCOUNTER — Ambulatory Visit (INDEPENDENT_AMBULATORY_CARE_PROVIDER_SITE_OTHER): Payer: Medicare Other | Admitting: *Deleted

## 2016-10-24 ENCOUNTER — Telehealth: Payer: Self-pay | Admitting: *Deleted

## 2016-10-24 ENCOUNTER — Ambulatory Visit: Payer: Medicaid Other | Admitting: Neurology

## 2016-10-24 ENCOUNTER — Telehealth: Payer: Self-pay | Admitting: Cardiology

## 2016-10-24 DIAGNOSIS — I639 Cerebral infarction, unspecified: Secondary | ICD-10-CM | POA: Diagnosis not present

## 2016-10-24 NOTE — Telephone Encounter (Signed)
Patient called and stated that she needed eye surgery and that the eye center that is doing her surgery will not perform surgery until her loop recorder is checked. I instructed pt to have eye center call us and we will provide answers to the questions they have. I instructed patient to send a manual transmission. Pt verbalized understanding.

## 2016-10-24 NOTE — Telephone Encounter (Signed)
No showed new patient appointment. 

## 2016-10-24 NOTE — Telephone Encounter (Signed)
[facility] ) is calling with questions  to clear Mrs. Skipper for eye surgery. Please call at [phone] ext [phone]

## 2016-10-24 NOTE — Telephone Encounter (Signed)
LVM for Tonya Sanders to call device clinic back.

## 2016-10-25 ENCOUNTER — Encounter: Payer: Self-pay | Admitting: Neurology

## 2016-10-25 NOTE — Progress Notes (Signed)
Carelink Summary Report / Loop Recorder 

## 2016-10-26 LAB — CUP PACEART REMOTE DEVICE CHECK
Date Time Interrogation Session: 20180918233746
Implantable Pulse Generator Implant Date: 20180620

## 2016-10-30 ENCOUNTER — Telehealth: Payer: Self-pay | Admitting: Cardiology

## 2016-10-30 NOTE — Telephone Encounter (Signed)
New messatge    Aram Beecham PA with [facility] care is calling again because pt needs to be cleared for eye surgery. Please call at [phone] ext [phone]

## 2016-10-30 NOTE — Telephone Encounter (Signed)
Left detailed voicemail at ext [phone] stating that it was ok for pt to have the eye surgery with a [employer] linq, device clinic number left for questions.

## 2016-10-31 ENCOUNTER — Telehealth: Payer: Self-pay | Admitting: Internal Medicine

## 2016-10-31 ENCOUNTER — Encounter: Payer: Self-pay | Admitting: Neurology

## 2016-10-31 NOTE — Telephone Encounter (Signed)
Spoke to Madelin Rear PA at [facility].Patient was scheduled to have cataract surgery this morning.Surgery was cancelled due to history of a CVA.Clydie Braun called [facility] and was told patient was assigned to Dr.Hilty.She told patient to contact our office to schedule appointment with Dr.Hilty for surgical clearance.Patient was also told she needs to schedule appointment with PCP.Clydie Braun stated patient is medical illiterate.

## 2016-10-31 NOTE — Telephone Encounter (Signed)
New message   Needs surgical clearance for patient-  Patient has not been seen in office since having loop recorder put in at hospital      [facility] Pre-operative Risk Assessment    Request for surgical clearance:  1. What type of surgery is being performed?  Cataract   When is this surgery scheduled? today 2. Are there any medications that need to be held prior to surgery and how long? Aspirin   3. Name of physician performing surgery?  Dr Philis Nettle  4. What is your office phone and fax number?  Fax [phone]  ofc [phone]   Berle Mull 10/31/2016, 8:57 AM  _________________________________________________________________   (provider comments below)

## 2016-11-01 NOTE — Telephone Encounter (Signed)
This patient needs an appointment for surgical clearance (although technically she does not need to be cleared for cataract surgery), however, since this is requested with me - please arrange. Ok to [employer].  Dr. Rexene Edison

## 2016-11-02 ENCOUNTER — Telehealth: Payer: Self-pay | Admitting: Internal Medicine

## 2016-11-02 NOTE — Telephone Encounter (Signed)
Closed Encounter  °

## 2016-11-03 NOTE — Telephone Encounter (Signed)
Attempted to contact patient at only phone # on file. This phone # is not in service.   Of note, Hilma Favors (scheduler) also tried to reach patient on 9/27 and was unsuccessful d/t phone not being in service.

## 2016-11-08 ENCOUNTER — Telehealth: Payer: Self-pay | Admitting: Cardiology

## 2016-11-08 NOTE — Telephone Encounter (Signed)
Attempted to call pt b/c her home monitor has not updated in at least 14 days. Automated message stating that the number has been changed, or is no longer in service.

## 2016-11-16 ENCOUNTER — Encounter: Payer: Self-pay | Admitting: Cardiology

## 2016-11-23 ENCOUNTER — Ambulatory Visit (INDEPENDENT_AMBULATORY_CARE_PROVIDER_SITE_OTHER): Payer: Medicare Other | Admitting: *Deleted

## 2016-11-23 DIAGNOSIS — I639 Cerebral infarction, unspecified: Secondary | ICD-10-CM | POA: Diagnosis not present

## 2016-11-24 LAB — CUP PACEART REMOTE DEVICE CHECK
Date Time Interrogation Session: 20181019000704
Implantable Pulse Generator Implant Date: 20180620

## 2016-11-24 NOTE — Progress Notes (Signed)
Carelink Summary Report / Loop Recorder 

## 2016-11-29 ENCOUNTER — Telehealth: Payer: Self-pay | Admitting: Cardiology

## 2016-11-29 NOTE — Telephone Encounter (Signed)
Attempted to call pt b/c her home monitor has not updated in at least 14 days. Automated message stating that the number has been changed or disconnected.

## 2016-12-08 ENCOUNTER — Encounter: Payer: Self-pay | Admitting: Cardiology

## 2016-12-21 ENCOUNTER — Encounter: Payer: Self-pay | Admitting: Cardiology

## 2016-12-25 ENCOUNTER — Encounter: Payer: Medicare Other | Admitting: *Deleted

## 2016-12-29 ENCOUNTER — Emergency Department (HOSPITAL_COMMUNITY): Payer: Medicare Other

## 2016-12-29 ENCOUNTER — Emergency Department (HOSPITAL_COMMUNITY)
Admission: EM | Admit: 2016-12-29 | Discharge: 2016-12-29 | Disposition: A | Discharge status: Home / Self Care | Payer: Medicare Other | Attending: Emergency Medicine | Admitting: Emergency Medicine

## 2016-12-29 ENCOUNTER — Other Ambulatory Visit: Payer: Self-pay

## 2016-12-29 ENCOUNTER — Encounter (HOSPITAL_COMMUNITY): Payer: Self-pay | Admitting: Emergency Medicine

## 2016-12-29 DIAGNOSIS — Z8673 Personal history of transient ischemic attack (TIA), and cerebral infarction without residual deficits: Secondary | ICD-10-CM | POA: Diagnosis not present

## 2016-12-29 DIAGNOSIS — Z7982 Long term (current) use of aspirin: Secondary | ICD-10-CM | POA: Insufficient documentation

## 2016-12-29 DIAGNOSIS — Y999 Unspecified external cause status: Secondary | ICD-10-CM | POA: Insufficient documentation

## 2016-12-29 DIAGNOSIS — S59911A Unspecified injury of right forearm, initial encounter: Secondary | ICD-10-CM | POA: Diagnosis present

## 2016-12-29 DIAGNOSIS — Z7901 Long term (current) use of anticoagulants: Secondary | ICD-10-CM | POA: Insufficient documentation

## 2016-12-29 DIAGNOSIS — Y939 Activity, unspecified: Secondary | ICD-10-CM | POA: Diagnosis not present

## 2016-12-29 DIAGNOSIS — E119 Type 2 diabetes mellitus without complications: Secondary | ICD-10-CM | POA: Diagnosis not present

## 2016-12-29 DIAGNOSIS — W109XXA Fall (on) (from) unspecified stairs and steps, initial encounter: Secondary | ICD-10-CM | POA: Insufficient documentation

## 2016-12-29 DIAGNOSIS — S52124A Nondisplaced fracture of head of right radius, initial encounter for closed fracture: Secondary | ICD-10-CM | POA: Insufficient documentation

## 2016-12-29 DIAGNOSIS — Y929 Unspecified place or not applicable: Secondary | ICD-10-CM | POA: Diagnosis not present

## 2016-12-29 DIAGNOSIS — F17200 Nicotine dependence, unspecified, uncomplicated: Secondary | ICD-10-CM | POA: Diagnosis not present

## 2016-12-29 DIAGNOSIS — Z794 Long term (current) use of insulin: Secondary | ICD-10-CM | POA: Insufficient documentation

## 2016-12-29 DIAGNOSIS — I10 Essential (primary) hypertension: Secondary | ICD-10-CM | POA: Diagnosis not present

## 2016-12-29 DIAGNOSIS — W19XXXA Unspecified fall, initial encounter: Secondary | ICD-10-CM

## 2016-12-29 DIAGNOSIS — S32010A Wedge compression fracture of first lumbar vertebra, initial encounter for closed fracture: Secondary | ICD-10-CM | POA: Insufficient documentation

## 2016-12-29 MED ORDER — IBUPROFEN 400 MG PO TABS
600.0000 mg | ORAL_TABLET | Freq: Once | ORAL | Status: AC
  Administered 2016-12-29: 10:00:00 600 mg via ORAL
  Filled 2016-12-29: qty 1

## 2016-12-29 MED ORDER — IBUPROFEN 400 MG PO TABS
400.0000 mg | ORAL_TABLET | Freq: Once | ORAL | Status: AC
  Administered 2016-12-29: 400 mg via ORAL
  Filled 2016-12-29: qty 1

## 2016-12-29 MED ORDER — OXYCODONE-ACETAMINOPHEN 5-325 MG PO TABS: 1.0000 | ORAL_TABLET | Freq: Three times a day (TID) | ORAL | 0 refills | Status: DC | PRN

## 2016-12-29 MED ORDER — ACETAMINOPHEN 325 MG PO TABS
325.0000 mg | ORAL_TABLET | Freq: Once | ORAL | Status: AC
  Administered 2016-12-29: 325 mg via ORAL
  Filled 2016-12-29: qty 1

## 2016-12-29 MED ORDER — OXYCODONE-ACETAMINOPHEN 5-325 MG PO TABS
1.0000 | ORAL_TABLET | Freq: Once | ORAL | Status: AC
  Administered 2016-12-29: 1 via ORAL
  Filled 2016-12-29: qty 1

## 2016-12-29 NOTE — ED Provider Notes (Signed)
[facility] EMERGENCY DEPARTMENT Provider Note   CSN: [id number] Arrival date & time: 12/29/16  7371     History   Chief Complaint Chief Complaint  Patient presents with  . Fall    HPI Tonya Sanders is a 56 y.o. female with history of HLD, HTN, chronic low back pain, MI, cerebellar stroke presents today with chief complaint acute onset, constant right forearm pain and left-sided low back pain and left hip pain secondary to fall yesterday.  She states she was walking down some steps when her left knee gave out and she fell forward landing on her left side, and hitting the back of her head.  She denies loss of consciousness.  She thinks she may have landed on her right forearm additionally because she complains of constant sharp aching right forearm pain with radiation up to the wrist and digits and upper arm.  Denies numbness, tingling, or weakness, but states that she is unable to use the extremity secondary to pain.  She also endorses aching midline low back pain which radiates to the left hip. Pain worsens with palpation and walking.  Denies bowel or bladder incontinence, saddle anesthesia, fevers, chills, or IV drug use.  She denies headaches, vision changes, nausea, vomiting, chest pain, or difficulty breathing.  She has not tried anything for her symptoms prior to arrival.  She has not had any of her home medications for greater than 1 month as she does not have a primary care physician to follow-up with at this time.  The history is provided by the patient.    Past Medical History:  Diagnosis Date  . Anxiety   . Arthritis    "all over" (07/19/2016)  . Asthma   . Bipolar disorder (HCC)   . Chronic bronchitis (HCC)   . Chronic lower back pain   . Depression   . GERD (gastroesophageal reflux disease)   . H. pylori infection   . Hyperlipidemia   . Hypertension   . Migraine    "a few/month" (07/19/2016)  . Myocardial infarction (HCC) 2016  . Neuropathy   .  Neuropathy    Hattie Perch 07/19/2016  . PUD (peptic ulcer disease)    hx/notes 06/22/2010  . Seizures (HCC)   . Stroke [facility])    "I've had several"; denies residual (07/19/2016)  . Type II diabetes mellitus [facility])     Patient Active Problem List   Diagnosis Date Noted  . Hypertension 07/25/2016  . Hyperlipidemia 07/25/2016  . Type II diabetes mellitus (HCC) 07/25/2016  . Seizures (HCC) 07/25/2016  . Cerebellar stroke (HCC)   . CVA (cerebral vascular accident) (HCC) 07/19/2016    Past Surgical History:  Procedure Laterality Date  . APPENDECTOMY    . CARDIAC CATHETERIZATION  2016  . CHOLECYSTECTOMY OPEN    . ESOPHAGOGASTRODUODENOSCOPY ENDOSCOPY  07/2005   Hattie Perch 06/22/2010  . FLEXIBLE SIGMOIDOSCOPY  07/2005   Hattie Perch 06/22/2010  . LOOP RECORDER INSERTION N/A 07/26/2016   Procedure: Loop Recorder Insertion;  Surgeon: Marinus Maw, MD;  Location: [facility] INVASIVE CV LAB;  Service: Cardiovascular;  Laterality: N/A;  . TEE WITHOUT CARDIOVERSION N/A 07/26/2016   Procedure: TRANSESOPHAGEAL ECHOCARDIOGRAM (TEE);  Surgeon: Chrystie Nose, MD;  Location: [facility] ENDOSCOPY;  Service: Cardiovascular;  Laterality: N/A;  . TUBAL LIGATION      OB History    No data available       Home Medications    Prior to Admission medications   Medication Sig Start Date End  Date Taking? Authorizing Provider  aspirin 81 MG chewable tablet Chew 1 tablet (81 mg total) by mouth daily. 07/22/16  Yes Darreld Mclean, MD  atorvastatin (LIPITOR) 40 MG tablet Take 1 tablet (40 mg total) by mouth daily at 6 PM. 07/21/16  Yes Darreld Mclean, MD  clopidogrel (PLAVIX) 75 MG tablet Take 1 tablet (75 mg total) by mouth daily. 07/22/16  Yes Darreld Mclean, MD  DULoxetine (CYMBALTA) 60 MG capsule Take 1 capsule (60 mg total) by mouth 2 (two) times daily. 07/21/16  Yes Darreld Mclean, MD  insulin detemir (LEVEMIR) 100 UNIT/ML injection Inject 50 Units into the skin 2 (two) times daily.   Yes [provider]  insulin lispro  (HUMALOG) 100 UNIT/ML cartridge Inject 0-50 Units into the skin daily. Per Sliding scale. Filled last 04-2016   Yes [provider]  lisinopril (PRINIVIL,ZESTRIL) 5 MG tablet Take 1 tablet (5 mg total) by mouth daily. 07/22/16  Yes Darreld Mclean, MD  Melatonin 5 MG TABS Take 1 tablet (5 mg total) by mouth at bedtime as needed (insomnia). 07/26/16  Yes Molt, Bethany, DO  metFORMIN (GLUCOPHAGE) 500 MG tablet Take 1 tablet (500 mg total) by mouth 2 (two) times daily with a meal. 07/26/16 07/26/17 Yes Molt, Bethany, DO  oxyCODONE-acetaminophen (PERCOCET/ROXICET) 5-325 MG tablet Take 1 tablet by mouth every 8 (eight) hours as needed for severe pain. 12/29/16   Jeanie Sewer, PA-C    Family History No family history on file.  Social History Social History   Tobacco Use  . Smoking status: Current Some Day Smoker  . Smokeless tobacco: Never Used  . Tobacco comment: 07/19/2016 "just smoke when I get upset"  Substance Use Topics  . Alcohol use: Yes  . Drug use: No     Allergies   Doxycycline; Penicillins; and Tomato   Review of Systems Review of Systems  Eyes: Negative for visual disturbance.  Respiratory: Negative for shortness of breath.   Cardiovascular: Negative for chest pain.  Gastrointestinal: Negative for abdominal pain, nausea and vomiting.  Musculoskeletal: Positive for arthralgias and back pain.  Neurological: Negative for syncope, weakness, numbness and headaches.  All other systems reviewed and are negative.    Physical Exam Updated Vital Signs BP 120/85   Pulse 68   Temp 98.3 F (36.8 C) (Oral)   Resp 14   Ht 5\' 4"  (1.626 m)   Wt 93 kg (205 lb)   SpO2 99%   BMI 35.19 kg/m   Physical Exam  Constitutional: She is oriented to person, place, and time. She appears well-developed and well-nourished. No distress.  HENT:  Head: Normocephalic and atraumatic.  Right Ear: External ear normal.  Left Ear: External ear normal.  No Battle's signs, no raccoon's eyes,  no rhinorrhea. No hemotympanum. No tenderness to palpation of the face or skull. No deformity, crepitus, or swelling noted.   Eyes: Conjunctivae are normal. Right eye exhibits no discharge. Left eye exhibits no discharge.  Neck: No JVD present. No tracheal deviation present.  Cardiovascular: Normal rate.  Pulmonary/Chest: Effort normal and breath sounds normal. She exhibits tenderness.  Left anterior chest wall tender to palpation overlying the costal margin.  No deformity, crepitus, ecchymosis, or paradoxical wall motion noted.  Abdominal: She exhibits no distension.  Musculoskeletal: She exhibits tenderness. She exhibits no edema.  Decreased range of motion of the right wrist and digits and elbow secondary to forearm pain.  Focally tender maximally on the proximal aspect of the forearm with some swelling noted.  Difficulty with pronation and supination secondary to pain.  No ecchymosis or deformity noted.  When prompted, 5/5 strength of BUE major muscle groups although it is painful in the right upper extremity.  There is midline lumbar spine tenderness  around L2/L3, with diffuse left paralumbar muscle tenderness.  No deformity, crepitus, or step-off noted.  Left hip tender to palpation laterally and anteriorly with normal range of motion.  5/5 strength of BLE major muscle groups.  Neurological: She is alert and oriented to person, place, and time. No sensory deficit. She exhibits normal muscle tone.  Fluent speech, no facial droop, sensation intact to soft touch of extremities, normal gait, and patient able to heel walk and toe walk without difficulty.   Skin: Skin is warm and dry. No erythema.  Psychiatric: She has a normal mood and affect. Her behavior is normal.  Nursing note and vitals reviewed.    ED Treatments / Results  Labs (all labs ordered are listed, but only abnormal results are displayed) Labs Reviewed - No data to display  EKG  EKG Interpretation None        Radiology Dg Ribs Unilateral W/chest Left  Result Date: 12/29/2016 CLINICAL DATA:  Fall downstairs with left chest pain, initial encounter EXAM: LEFT RIBS AND CHEST - 3+ VIEW COMPARISON:  07/19/2016 FINDINGS: Cardiac shadow is mildly enlarged but stable. Loop recorder is noted along the anterior chest wall. The lungs are hypoinflated although no pneumothorax or focal infiltrate is seen. No acute rib fracture is noted. IMPRESSION: No evidence of acute rib fracture. Electronically Signed   By: Alcide Clever M.D.   On: 12/29/2016 07:56   Dg Lumbar Spine Complete  Result Date: 12/29/2016 CLINICAL DATA:  Larey Seat down 5-6 steps earlier today. Landed on left hip and then hit right arm. Pain at midline of mid lumbar spine. NO bruising. Can move but feel extremely stiff. EXAM: LUMBAR SPINE - COMPLETE 4+ VIEW COMPARISON:  CT abdomen dated 08/01/2005. FINDINGS: Acute appearing compression fracture deformity involving the superior endplate of the L1 vertebral body. There is approximately 10-15% compression of the superior endplate. No vertebral body displacement or retropulsion. Remainder of the lumbar spine appears intact and normal in height. There is stable levoscoliosis of the lower lumbar spine. Mild degenerative changes noted in the lower lumbar spine with associated disc space narrowings and mild osseous spurring. No evidence of pars interarticularis defect. Visualized paravertebral soft tissues are unremarkable. IMPRESSION: 1. Acute appearing compression fracture deformity involving the superior endplate of the L1 vertebral body, approximately 10% compression, but no vertebral body displacement or retropulsion. 2. Mild degenerative change in the lower lumbar spine. 3. Scoliosis. Electronically Signed   By: Bary Richard M.D.   On: 12/29/2016 10:57   Dg Forearm Right  Result Date: 12/29/2016 CLINICAL DATA:  Fall down steps with arm pain, initial encounter EXAM: RIGHT FOREARM - 2 VIEW COMPARISON:  None.  FINDINGS: There is a comminuted fracture involving the radial head and extending into the articular surface. Joint effusion is noted. No other focal abnormality is seen. IMPRESSION: Radial head fracture with intra-articular involvement and joint effusion. Electronically Signed   By: Alcide Clever M.D.   On: 12/29/2016 10:52   Dg Wrist Complete Right  Result Date: 12/29/2016 CLINICAL DATA:  Fall down 5 steps, arm pain EXAM: RIGHT WRIST - COMPLETE 3+ VIEW COMPARISON:  None. FINDINGS: No fracture or dislocation is seen. The joint spaces are preserved. The visualized soft tissues are unremarkable. IMPRESSION: Negative. Electronically Signed  By: Charline BillsSriyesh  Krishnan M.D.   On: 12/29/2016 07:57   Dg Hip Unilat W Or Wo Pelvis 1 View Left  Result Date: 12/29/2016 CLINICAL DATA:  Fall down steps with left hip pain, initial encounter EXAM: DG HIP (WITH OR WITHOUT PELVIS) 3V LEFT COMPARISON:  None. FINDINGS: Visualized pelvic ring is within normal limits. No dislocation is identified. Mild irregularity is noted along the superior aspect of the left femoral neck which appears similar to that seen on the right and likely within normal limits. No other fracture is seen. IMPRESSION: No definitive acute abnormality noted. Electronically Signed   By: Alcide CleverMark  Lukens M.D.   On: 12/29/2016 07:57  Radiographs show no evidence of rib fracture or  Procedures Procedures (including critical care time)  Medications Ordered in ED Medications  ibuprofen (ADVIL,MOTRIN) tablet 400 mg (400 mg Oral Given 12/29/16 0651)  oxyCODONE-acetaminophen (PERCOCET/ROXICET) 5-325 MG per tablet 1 tablet (1 tablet Oral Given 12/29/16 0944)  ibuprofen (ADVIL,MOTRIN) tablet 600 mg (600 mg Oral Given 12/29/16 0944)  ibuprofen (ADVIL,MOTRIN) tablet 400 mg (400 mg Oral Given 12/29/16 0953)  acetaminophen (TYLENOL) tablet 325 mg (325 mg Oral Given 12/29/16 1241)     Initial Impression / Assessment and Plan / ED Course  I have reviewed the  triage vital signs and the nursing notes.  Pertinent labs & imaging results that were available during my care of the patient were reviewed by me and considered in my medical decision making (see chart for details).     Patient presents after mechanical fall yesterday.  Denies loss of consciousness but thinks she struck the back of her head when she fell.  Denies headache, nausea, vomiting, altered mental status, amnesia, or vision changes.  No advanced imaging required of the head or neck as low suspicion of acute intracranial injury or cervical spine injury.  Afebrile, vital signs are stable.  She is prescribed Plavix but has not been taking this for at least 1 month due to running out of refills and requiring primary care follow-up.  Acute cardiopulmonary abnormality.  No evidence of pelvic fracture.  Radiographs of the right forearm reviewed by me showRadial head fracture with intra-articular involvement and joint effusion and radiographs of the lumbar spine show  Acute appearing compression fracture deformity involving the superior endplate of the L1 vertebral body, approximately 10% compression, but no vertebral body displacement or retropulsion. She is neurovascularly intact.  No red flag signs concerning for cauda equina.  Will place in a posterior long-arm splint for right radial head fracture.  Discussed management of pain of arm and lumbar spine with NSAIDs, ice, compression, and small amount of pain medication for severe pain.  She will follow-up with orthopedics for reevaluation of her symptoms.  Discussed indications for return to the ED. Pt verbalized understanding of and agreement with plan and is safe for discharge home at this time.  Pain has been managed while in the ED and she has no complaints prior to discharge.  Final Clinical Impressions(s) / ED Diagnoses   Final diagnoses:  Closed nondisplaced fracture of head of right radius, initial encounter  Closed compression fracture of  first lumbar vertebra, initial encounter [facility](HCC)  Fall, initial encounter    ED Discharge Orders        Ordered    oxyCODONE-acetaminophen (PERCOCET/ROXICET) 5-325 MG tablet  Every 8 hours PRN     12/29/16 1153       Jeanie SewerFawze, Kaena Santori A, PA-C 12/29/16 1700    Gerhard MunchLockwood, Robert, MD 12/30/16  0756  

## 2016-12-29 NOTE — ED Notes (Signed)
Patient dropped  Ibuprofen on floor. Pharmacy made aware only 200 mg given.

## 2016-12-29 NOTE — ED Notes (Signed)
Ortho tech paged for splint.

## 2016-12-29 NOTE — Discharge Instructions (Signed)
Take 600 mg of ibuprofen every 6 hours as needed for pain.  You may take Percocet as needed for severe pain but do not drive, drink alcohol, or operate heavy machinery on this medication as it may make you drowsy.  Apply ice for comfort.  Keep your right arm in the sling and splint at all times.  Cover with a plastic bag when you bathe or shower.  Follow-up with an orthopedic physician for reevaluation of your forearm fracture and spine fracture.  Return to the ED immediately for any concerning signs or develop such as fevers, numbness, weakness, severe swelling or pain, or loss of bowel or bladder control.  Follow-up with a primary care physician for medication management.

## 2016-12-29 NOTE — ED Triage Notes (Signed)
Pt reports fall yesterday down 5-6 stairs, states her knees gave out. Denies LOC. No meds PTA. Reports pain in L side and back, R wrist pain.

## 2016-12-29 NOTE — ED Notes (Signed)
Pt refused to be connected to heart monitor.

## 2016-12-29 NOTE — ED Notes (Signed)
Per xray-- pt had an episode of weakness/near syncope when standing for films, xray will transport pt to triage area [facility] on stretcher.

## 2016-12-29 NOTE — ED Notes (Signed)
Ortho tech at bedside 

## 2016-12-29 NOTE — Progress Notes (Signed)
Orthopedic Tech Progress Note Patient Details:  Tonya Sanders 1961/01/18 [id number]  Ortho Devices Type of Ortho Device: Arm sling, Post (long arm) splint Ortho Device/Splint Location: applied long arm splint to pt right arm.  pt tolerated application very well.  provided arm sling to pt right arm for support.  right arm.  Ortho Device/Splint Interventions: Application, Adjustment   Alvina Chou 12/29/2016, 12:42 PM

## 2017-01-17 ENCOUNTER — Telehealth: Payer: Self-pay | Admitting: Cardiology

## 2017-01-17 NOTE — Telephone Encounter (Signed)
Attempted to call pt b/c her home monitor has not updated in at least 14 days. No answer and unable to leave a message. Automated message stating that the number is no longer in service.

## 2017-01-23 ENCOUNTER — Ambulatory Visit (INDEPENDENT_AMBULATORY_CARE_PROVIDER_SITE_OTHER): Payer: Medicare Other | Admitting: *Deleted

## 2017-01-23 DIAGNOSIS — I639 Cerebral infarction, unspecified: Secondary | ICD-10-CM

## 2017-01-23 NOTE — Progress Notes (Signed)
Carelink Summary Report / Loop Recorder 

## 2017-01-24 ENCOUNTER — Encounter: Payer: Self-pay | Admitting: Cardiology

## 2017-02-07 LAB — CUP PACEART REMOTE DEVICE CHECK
MDC IDC PG IMPLANT DT: 20180620
MDC IDC SESS DTM: 20181218013917

## 2017-02-13 ENCOUNTER — Encounter: Payer: Self-pay | Admitting: Cardiology

## 2017-02-21 ENCOUNTER — Encounter: Payer: Medicare Other | Admitting: *Deleted

## 2017-03-29 ENCOUNTER — Encounter: Payer: Self-pay | Admitting: Neurology

## 2017-03-30 ENCOUNTER — Telehealth: Payer: Self-pay

## 2017-03-30 NOTE — Telephone Encounter (Signed)
ED NOTES TO NL

## 2017-04-09 ENCOUNTER — Telehealth: Payer: Self-pay

## 2017-04-09 NOTE — Telephone Encounter (Signed)
FAXED NOTES TO NL 

## 2017-04-11 ENCOUNTER — Encounter (HOSPITAL_COMMUNITY): Payer: Self-pay

## 2017-04-11 ENCOUNTER — Emergency Department (HOSPITAL_COMMUNITY)
Admission: EM | Admit: 2017-04-11 | Discharge: 2017-04-11 | Disposition: A | Discharge status: Home / Self Care | Payer: Medicare Other | Attending: Emergency Medicine | Admitting: Emergency Medicine

## 2017-04-11 ENCOUNTER — Other Ambulatory Visit: Payer: Self-pay

## 2017-04-11 DIAGNOSIS — J45909 Unspecified asthma, uncomplicated: Secondary | ICD-10-CM | POA: Insufficient documentation

## 2017-04-11 DIAGNOSIS — F17210 Nicotine dependence, cigarettes, uncomplicated: Secondary | ICD-10-CM | POA: Diagnosis not present

## 2017-04-11 DIAGNOSIS — Z79899 Other long term (current) drug therapy: Secondary | ICD-10-CM | POA: Insufficient documentation

## 2017-04-11 DIAGNOSIS — I10 Essential (primary) hypertension: Secondary | ICD-10-CM | POA: Insufficient documentation

## 2017-04-11 DIAGNOSIS — R21 Rash and other nonspecific skin eruption: Secondary | ICD-10-CM

## 2017-04-11 DIAGNOSIS — Z794 Long term (current) use of insulin: Secondary | ICD-10-CM | POA: Insufficient documentation

## 2017-04-11 DIAGNOSIS — E1140 Type 2 diabetes mellitus with diabetic neuropathy, unspecified: Secondary | ICD-10-CM | POA: Insufficient documentation

## 2017-04-11 DIAGNOSIS — Z7902 Long term (current) use of antithrombotics/antiplatelets: Secondary | ICD-10-CM | POA: Insufficient documentation

## 2017-04-11 DIAGNOSIS — Z7982 Long term (current) use of aspirin: Secondary | ICD-10-CM | POA: Insufficient documentation

## 2017-04-11 MED ORDER — HYDROCORTISONE 1 % EX CREA: TOPICAL_CREAM | CUTANEOUS | 0 refills | Status: AC

## 2017-04-11 MED ORDER — MUPIROCIN CALCIUM 2 % EX CREA: 1.0000 "application " | TOPICAL_CREAM | Freq: Two times a day (BID) | CUTANEOUS | 0 refills | Status: AC

## 2017-04-11 NOTE — ED Triage Notes (Addendum)
Patient here with itchy red rash to left arm, right lower leg and right foot. No known exposure, complains of increased itching. States started 1 week ago, NAD

## 2017-04-11 NOTE — ED Provider Notes (Signed)
[facility] EMERGENCY DEPARTMENT Provider Note   CSN: [id number] Arrival date & time: 04/11/17  0800     History   Chief Complaint Chief Complaint  Patient presents with  . Rash    HPI Tonya Sanders is a 57 y.o. female with PMH/o HTN, GERD, PUD, DM who presents for evaluation of rash to LUE and RLE x 1 week. Patient reports that the rash is pruritic but not painful. She denies any burning pain. She states that she used some triple antibiotic ointment with no improvement in symptoms. Patient reports that her husband and son at home do not have same symptoms. She does report that they had bought a different detergent prior to onset of symptoms. She denies any other new exposures, new foods, medicines, soaps, lotions. Patient denies any fever, CP, SOB, abdominal pain, numbness/weakness, oral lesions.   The history is provided by the patient.    Past Medical History:  Diagnosis Date  . Anxiety   . Arthritis    "all over" (07/19/2016)  . Asthma   . Bipolar disorder (HCC)   . Chronic bronchitis (HCC)   . Chronic lower back pain   . Depression   . GERD (gastroesophageal reflux disease)   . H. pylori infection   . Hyperlipidemia   . Hypertension   . Migraine    "a few/month" (07/19/2016)  . Myocardial infarction (HCC) 2016  . Neuropathy   . Neuropathy    Hattie Perch 07/19/2016  . PUD (peptic ulcer disease)    hx/notes 06/22/2010  . Seizures (HCC)   . Stroke [facility])    "I've had several"; denies residual (07/19/2016)  . Type II diabetes mellitus [facility])     Patient Active Problem List   Diagnosis Date Noted  . Hypertension 07/25/2016  . Hyperlipidemia 07/25/2016  . Type II diabetes mellitus (HCC) 07/25/2016  . Seizures (HCC) 07/25/2016  . Cerebellar stroke (HCC)   . CVA (cerebral vascular accident) (HCC) 07/19/2016    Past Surgical History:  Procedure Laterality Date  . APPENDECTOMY    . CARDIAC CATHETERIZATION  2016  . CHOLECYSTECTOMY OPEN    .  ESOPHAGOGASTRODUODENOSCOPY ENDOSCOPY  07/2005   Hattie Perch 06/22/2010  . FLEXIBLE SIGMOIDOSCOPY  07/2005   Hattie Perch 06/22/2010  . LOOP RECORDER INSERTION N/A 07/26/2016   Procedure: Loop Recorder Insertion;  Surgeon: Marinus Maw, MD;  [facility] INVAS[facility] CV LAB;  Service: Cardiovascular;  Laterality: N/A;  . TEE WITHOUT CARDIOVERSION N/A 07/26/2016   Procedure: TRANSESOPHAGEAL ECHOCARDIOGRAM (TEE);  Surgeon: Chrystie Nose, MD;  Location: [facility] ENDOSCOPY;  Service: Cardiovascular;  Laterality: N/A;  . TUBAL LIGATION      OB History    No data available       Home Medications    Prior to Admission medications   Medication Sig Start Date End Date Taking? Authorizing Provider  aspirin 81 MG chewable tablet Chew 1 tablet (81 mg total) by mouth daily. 07/22/16   Darreld Mclean, MD  atorvastatin (LIPITOR) 40 MG tablet Take 1 tablet (40 mg total) by mouth daily at 6 PM. 07/21/16   Darreld Mclean, MD  clopidogrel (PLAVIX) 75 MG tablet Take 1 tablet (75 mg total) by mouth daily. 07/22/16   Darreld Mclean, MD  DULoxetine (CYMBALTA) 60 MG capsule Take 1 capsule (60 mg total) by mouth 2 (two) times daily. 07/21/16   Darreld Mclean, MD  hydrocortisone cream 1 % Apply to affected area 2 times daily 04/11/17   Graciella Freer A, PA-C  insulin  detemir (LEVEMIR) 100 UNIT/ML injection Inject 50 Units into the skin 2 (two) times daily.    [provider]  insulin lispro (HUMALOG) 100 UNIT/ML cartridge Inject 0-50 Units into the skin daily. Per Sliding scale. Filled last 04-2016    [provider]  lisinopril (PRINIVIL,ZESTRIL) 5 MG tablet Take 1 tablet (5 mg total) by mouth daily. 07/22/16   Darreld Mclean, MD  Melatonin 5 MG TABS Take 1 tablet (5 mg total) by mouth at bedtime as needed (insomnia). 07/26/16   Molt, Bethany, DO  metFORMIN (GLUCOPHAGE) 500 MG tablet Take 1 tablet (500 mg total) by mouth 2 (two) times daily with a meal. 07/26/16 07/26/17  Molt, Bethany, DO  mupirocin cream (BACTROBAN) 2 %  Apply 1 application topically 2 (two) times daily. 04/11/17   Maxwell Caul, PA-C  oxyCODONE-acetaminophen (PERCOCET/ROXICET) 5-325 MG tablet Take 1 tablet by mouth every 8 (eight) hours as needed for severe pain. 12/29/16   Jeanie Sewer, PA-C    Family History No family history on file.  Social History Social History   Tobacco Use  . Smoking status: Current Some Day Smoker  . Smokeless tobacco: Never Used  . Tobacco comment: 07/19/2016 "just smoke when I get upset"  Substance Use Topics  . Alcohol use: Yes  . Drug use: No     Allergies   Doxycycline; Penicillins; and Tomato   Review of Systems Review of Systems  Constitutional: Negative for fever.  Respiratory: Negative for shortness of breath.   Cardiovascular: Negative for chest pain.  Skin: Positive for rash.     Physical Exam Updated Vital Signs BP (!) 175/98 (BP Location: Right Arm)   Pulse 90   Temp 97.6 F (36.4 C) (Oral)   Resp 18   SpO2 99%   Physical Exam  Constitutional: She appears well-developed and well-nourished.  HENT:  Head: Normocephalic and atraumatic.  Mouth/Throat: Oropharynx is clear and moist.  No oral lesions  Eyes: Conjunctivae and EOM are normal. Right eye exhibits no discharge. Left eye exhibits no discharge. No scleral icterus.  Cardiovascular:  Pulses:      Dorsalis pedis pulses are 2+ on the right side, and 2+ on the left side.  Pulmonary/Chest: Effort normal and breath sounds normal.  No evidence of respiratory distress. Able to speak in full sentences without difficulty.  Musculoskeletal:  No tenderness palpation of left elbow.  Full range of motion of left elbow without any difficulty.  Neurological: She is alert.  Skin: Skin is warm and dry. Capillary refill takes less than 2 seconds.  Small, scaly, patchy area of erythema and dry skin noted to the posterior aspect of the proximal left forearm just distal to the elbow.  No warmth, erythema overlying the left elbow.   Moderately sized area of erythematous pustules noted to the posterior aspect of the right lower extremity.  No surrounding warmth, erythema, induration.  No fluctuance noted.  No petechiae noted.  No rash noted to the palms of bilateral hands, soles of bilateral feet.  Psychiatric: She has a normal mood and affect. Her speech is normal and behavior is normal.  Nursing note and vitals reviewed.    ED Treatments / Results  Labs (all labs ordered are listed, but only abnormal results are displayed) Labs Reviewed - No data to display  EKG  EKG Interpretation None       Radiology No results found.  Procedures Procedures (including critical care time)  Medications Ordered in ED Medications - No data  to display   Initial Impression / Assessment and Plan / ED Course  I have reviewed the triage vital signs and the nursing notes.  Pertinent labs & imaging results that were available during my care of the patient were reviewed by me and considered in my medical decision making (see chart for details).     57 year old 13-year-old female who presents for evaluation of rash times 1 week.  Patient notes a red, dry area to the left upper extremity and a red, itchy area noted to the posterior aspect of her right lower extremity.  Does report that she used some new detergent but denies any other new exposures.  No one else in the house has similar symptoms.  Patient denies any fevers. Patient is afebrile, non-toxic appearing, sitting comfortably on examination table.  Patient is slightly hypertensive.  She is not complaining of any chest pain, difficulty breathing, headache, vision changes, numbness/weakness of her arms or legs.  On exam, patient has a dry, scaly erythematous patch noted to the posterior aspect of her left upper extremity.  It does not overlie the elbow.  And also with moderately sized area of erythematous pustules noted to the posterior aspect of right lower extremity.  No  surrounding warmth, erythema.  No fluctuance noted.  Consider contact dermatitis versus folliculitis versus eczema/psoriasis.  I suspect that the patchy skin on patient's upper arm is due to eczema, well area on right lower extremity appears more to be consistent with folliculitis.  Physical exam is not concerning for shingles, SJS, TENS, syphilis, abscess, cellulitis.  Plan for supportive therapies at home.  Patient instructed to follow-up with primary care doctor in the next 24-48 hours for further evaluation. Patient had ample opportunity for questions and discussion. All patient's questions were answered with full understanding. Strict return precautions discussed. Patient expresses understanding and agreement to plan.   Final Clinical Impressions(s) / ED Diagnoses   Final diagnoses:  Rash    ED Discharge Orders        Ordered    hydrocortisone cream 1 %     04/11/17 0825    mupirocin cream (BACTROBAN) 2 %  2 times daily     04/11/17 0825       Maxwell Caul, PA-C 04/11/17 1610    Mancel Bale, MD 04/11/17 1824

## 2017-04-11 NOTE — Discharge Instructions (Signed)
°  Apply the hydrocortisone cream to your arm to help with the itching.   Apply the Bactroban ointment to the leg.   Follow-up with your primary care doctor in the next 2-4 days for further evaluation.  Return to the Emergency Department for any worsening rash, fever, chest pain, abdominal pain, difficulty breathing, worsening redness/swelling of the leg or arm, or any other worsening or concerning symptoms.

## 2017-04-11 NOTE — ED Notes (Signed)
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 Paperwork discussed with pt. And pt. Did not have any further questions.

## 2017-05-03 ENCOUNTER — Telehealth: Payer: Self-pay | Admitting: Cardiovascular Disease

## 2017-05-03 NOTE — Telephone Encounter (Signed)
Received incoming records from [facility] for upcoming appointment on 06/08/17 @ 8:40am with Dr. Royann Shivers. Records given to [facility] in Medical Records. 05/03/17 ab

## 2017-05-17 ENCOUNTER — Encounter (HOSPITAL_COMMUNITY): Payer: Self-pay | Admitting: *Deleted

## 2017-05-17 ENCOUNTER — Other Ambulatory Visit: Payer: Self-pay

## 2017-05-17 ENCOUNTER — Emergency Department (HOSPITAL_COMMUNITY)
Admission: EM | Admit: 2017-05-17 | Discharge: 2017-05-17 | Disposition: A | Discharge status: Home / Self Care | Payer: Medicare Other | Attending: Emergency Medicine | Admitting: Emergency Medicine

## 2017-05-17 ENCOUNTER — Emergency Department (HOSPITAL_COMMUNITY): Payer: Medicare Other

## 2017-05-17 DIAGNOSIS — F17200 Nicotine dependence, unspecified, uncomplicated: Secondary | ICD-10-CM | POA: Diagnosis not present

## 2017-05-17 DIAGNOSIS — Z794 Long term (current) use of insulin: Secondary | ICD-10-CM | POA: Insufficient documentation

## 2017-05-17 DIAGNOSIS — J45909 Unspecified asthma, uncomplicated: Secondary | ICD-10-CM | POA: Insufficient documentation

## 2017-05-17 DIAGNOSIS — I10 Essential (primary) hypertension: Secondary | ICD-10-CM | POA: Diagnosis not present

## 2017-05-17 DIAGNOSIS — Z79899 Other long term (current) drug therapy: Secondary | ICD-10-CM | POA: Insufficient documentation

## 2017-05-17 DIAGNOSIS — Z7982 Long term (current) use of aspirin: Secondary | ICD-10-CM | POA: Diagnosis not present

## 2017-05-17 DIAGNOSIS — R2231 Localized swelling, mass and lump, right upper limb: Secondary | ICD-10-CM | POA: Diagnosis present

## 2017-05-17 DIAGNOSIS — E1140 Type 2 diabetes mellitus with diabetic neuropathy, unspecified: Secondary | ICD-10-CM | POA: Insufficient documentation

## 2017-05-17 DIAGNOSIS — L03011 Cellulitis of right finger: Secondary | ICD-10-CM | POA: Insufficient documentation

## 2017-05-17 DIAGNOSIS — Z7902 Long term (current) use of antithrombotics/antiplatelets: Secondary | ICD-10-CM | POA: Diagnosis not present

## 2017-05-17 DIAGNOSIS — R21 Rash and other nonspecific skin eruption: Secondary | ICD-10-CM | POA: Diagnosis not present

## 2017-05-17 LAB — CBG MONITORING, ED: Glucose-Capillary: 201 mg/dL — ABNORMAL HIGH (ref 65–99)

## 2017-05-17 MED ORDER — HYDROCODONE-ACETAMINOPHEN 5-325 MG PO TABS: 1.0000 | ORAL_TABLET | Freq: Four times a day (QID) | ORAL | 0 refills | Status: DC | PRN

## 2017-05-17 MED ORDER — SULFAMETHOXAZOLE-TRIMETHOPRIM 800-160 MG PO TABS: 1.0000 | ORAL_TABLET | Freq: Two times a day (BID) | ORAL | 0 refills | Status: AC

## 2017-05-17 MED ORDER — LIDOCAINE HCL (PF) 1 % IJ SOLN
10.0000 mL | Freq: Once | INTRAMUSCULAR | Status: DC
  Filled 2017-05-17: qty 10

## 2017-05-17 MED ORDER — TRIAMCINOLONE ACETONIDE 0.1 % EX CREA: 1.0000 "application " | TOPICAL_CREAM | Freq: Two times a day (BID) | CUTANEOUS | 0 refills | Status: DC

## 2017-05-17 MED ORDER — BUPIVACAINE HCL (PF) 0.25 % IJ SOLN
10.0000 mL | Freq: Once | INTRAMUSCULAR | Status: DC
  Filled 2017-05-17: qty 30

## 2017-05-17 MED ORDER — LIDOCAINE-EPINEPHRINE-TETRACAINE (LET) SOLUTION
3.0000 mL | Freq: Once | NASAL | Status: DC
  Filled 2017-05-17: qty 3

## 2017-05-17 MED ORDER — BUPIVACAINE HCL 0.25 % IJ SOLN: 10.0000 mL | Freq: Once | INTRAMUSCULAR | Status: DC

## 2017-05-17 MED ORDER — LIDOCAINE VISCOUS 2 % MT SOLN
15.0000 mL | Freq: Once | OROMUCOSAL | Status: DC
Start: 2017-05-17 — End: 2017-05-17

## 2017-05-17 NOTE — ED Provider Notes (Signed)
[place] EMERGENCY DEPARTMENT Provider Note   CSN: [id number] Arrival date & time: 05/17/17  0160     History   Chief Complaint Chief Complaint  Patient presents with  . Hand Injury    HPI Genevive D Laham is a 57 y.o. female.  HPI  Patient is a 57 year old female with a history of MI, CVA, type 2 diabetes mellitus, bipolar disorder, and seizures presenting for right ring finger redness and pain.  Patient reports that the pain woke her up at approximately 12 AM and she noticed there was swelling around the radial aspect of the right ring finger.  Patient denies any drainage from the site.  Patient denies any spreading of the arm.  Patient denies any fevers or chills.  No recent injury to the finger.  Patient does work in a [employer], but reports that she wears gloves.  Tetanus shot up-to-date.  Patient also noting that she has an erythematous rash to her right posterior thigh and left elbow.  Patient reports is been there approximately 1 month and is not improving.  Patient describes it is pruritic.  Patient denies any spreading erythema, drainage from the rash.  Patient denies any recent changes in lotions, detergents, soaps.  Patient denies any recent medication changes.  No prior dermatology follow-up or conditions.  Past Medical History:  Diagnosis Date  . Anxiety   . Arthritis    "all over" (07/19/2016)  . Asthma   . Bipolar disorder ([place])   . Chronic bronchitis ([place])   . Chronic lower back pain   . Depression   . GERD (gastroesophageal reflux disease)   . H. pylori infection   . Hyperlipidemia   . Hypertension   . Migraine    "a few/month" (07/19/2016)  . Myocardial infarction ([place]) 2016  . Neuropathy   . Neuropathy    Archie Endo 07/19/2016  . PUD (peptic ulcer disease)    hx/notes 06/22/2010  . Seizures ([place])   . Stroke [facility])    "I've had several"; denies residual (07/19/2016)  . Type II diabetes mellitus [facility])     Patient Active Problem  List   Diagnosis Date Noted  . Hypertension 07/25/2016  . Hyperlipidemia 07/25/2016  . Type II diabetes mellitus ([place]) 07/25/2016  . Seizures ([place]) 07/25/2016  . Cerebellar stroke ([place])   . CVA (cerebral vascular accident) ([place]) 07/19/2016    Past Surgical History:  Procedure Laterality Date  . APPENDECTOMY    . CARDIAC CATHETERIZATION  2016  . CHOLECYSTECTOMY OPEN    . ESOPHAGOGASTRODUODENOSCOPY ENDOSCOPY  07/2005   Archie Endo 06/22/2010  . FLEXIBLE SIGMOIDOSCOPY  07/2005   Archie Endo 06/22/2010  . LOOP RECORDER INSERTION N/A 07/26/2016   Procedure: Loop Recorder Insertion;  Surgeon: Evans Lance, MD;  Location: [place] CV LAB;  Service: Cardiovascular;  Laterality: N/A;  . TEE WITHOUT CARDIOVERSION N/A 07/26/2016   Procedure: TRANSESOPHAGEAL ECHOCARDIOGRAM (TEE);  Surgeon: Pixie Casino, MD;  Location: [facility] ENDOSCOPY;  Service: Cardiovascular;  Laterality: N/A;  . TUBAL LIGATION       OB History   None      Home Medications    Prior to Admission medications   Medication Sig Start Date End Date Taking? Authorizing Provider  aspirin 81 MG chewable tablet Chew 1 tablet (81 mg total) by mouth daily. 07/22/16   Zada Finders, MD  atorvastatin (LIPITOR) 40 MG tablet Take 1 tablet (40 mg total) by mouth daily at 6 PM. 07/21/16   Zada Finders, MD  clopidogrel (PLAVIX) 75 MG tablet Take 1 tablet (75 mg total) by mouth daily. 07/22/16   Zada Finders, MD  DULoxetine (CYMBALTA) 60 MG capsule Take 1 capsule (60 mg total) by mouth 2 (two) times daily. 07/21/16   Zada Finders, MD  hydrocortisone cream 1 % Apply to affected area 2 times daily 04/11/17   Providence Lanius A, PA-C  insulin detemir (LEVEMIR) 100 UNIT/ML injection Inject 50 Units into the skin 2 (two) times daily.    [provider]  insulin lispro (HUMALOG) 100 UNIT/ML cartridge Inject 0-50 Units into the skin daily. Per Sliding scale. Filled last 04-2016    [provider]  lisinopril (PRINIVIL,ZESTRIL) 5 MG tablet  Take 1 tablet (5 mg total) by mouth daily. 07/22/16   Zada Finders, MD  Melatonin 5 MG TABS Take 1 tablet (5 mg total) by mouth at bedtime as needed (insomnia). 07/26/16   Molt, Bethany, DO  metFORMIN (GLUCOPHAGE) 500 MG tablet Take 1 tablet (500 mg total) by mouth 2 (two) times daily with a meal. 07/26/16 07/26/17  Molt, Bethany, DO  mupirocin cream (BACTROBAN) 2 % Apply 1 application topically 2 (two) times daily. 04/11/17   Volanda Napoleon, PA-C  oxyCODONE-acetaminophen (PERCOCET/ROXICET) 5-325 MG tablet Take 1 tablet by mouth every 8 (eight) hours as needed for severe pain. 12/29/16   Renita Papa, PA-C    Family History No family history on file.  Social History Social History   Tobacco Use  . Smoking status: Current Some Day Smoker  . Smokeless tobacco: Never Used  . Tobacco comment: 07/19/2016 "just smoke when I get upset"  Substance Use Topics  . Alcohol use: Yes  . Drug use: No     Allergies   Doxycycline; Penicillins; and Tomato   Review of Systems Review of Systems  Constitutional: Negative for chills and fever.  HENT: Negative for congestion and rhinorrhea.   Respiratory: Negative for chest tightness, shortness of breath and wheezing.   Musculoskeletal: Negative for myalgias.  Skin: Positive for color change and rash. Negative for wound.     Physical Exam Updated Vital Signs BP (!) 176/98 (BP Location: Right Arm)   Pulse 85   Temp 98.1 F (36.7 C) (Oral)   Resp 17   SpO2 95%   Physical Exam  Constitutional: She appears well-developed and well-nourished. No distress.  Sitting comfortably in bed.  HENT:  Head: Normocephalic and atraumatic.  Eyes: Conjunctivae are normal. Right eye exhibits no discharge. Left eye exhibits no discharge.  EOMs normal to gross examination.  Neck: Normal range of motion.  Cardiovascular: Normal rate, regular rhythm and normal heart sounds.  Intact, 2+ radial pulse.  Pulmonary/Chest: Effort normal and breath sounds normal. She  has no wheezes. She has no rales.  Normal respiratory effort. Patient converses comfortably. No audible wheeze or stridor.  Abdominal: She exhibits no distension.  Musculoskeletal: Normal range of motion.  Hand Exam:  Inspection: See clinical photo for details.  The right ring finger exhibits fluctuance and erythema to the radial aspect extending around to the pad of the right ring finger. Sensation intact to distal tip of the right ring finger.  Vascular: 2+ radial and ulnar pulses. Capillary refill <2 seconds b/l.   Neurological: She is alert.  Cranial nerves intact to gross observation. Patient moves extremities without difficulty.  Skin: Skin is warm and dry. She is not diaphoretic.  Psychiatric: She has a normal mood and affect. Her behavior is normal. Judgment and thought content normal.  Nursing note and vitals reviewed.           ED Treatments / Results  Labs (all labs ordered are listed, but only abnormal results are displayed) Labs Reviewed - No data to display  EKG None  Radiology No results found.  Procedures .Marland KitchenIncision and Drainage Date/Time: 05/17/2017 12:58 PM Performed by: Albesa Seen, PA-C Authorized by: Albesa Seen, PA-C   Consent:    Consent obtained:  Verbal   Consent given by:  Patient   Risks discussed:  Incomplete drainage, bleeding and pain Location:    Type:  Abscess   Location:  Upper extremity   Upper extremity location:  Finger   Finger location:  R ring finger Pre-procedure details:    Skin preparation:  Betadine Anesthesia (see MAR for exact dosages):    Anesthesia method:  Nerve block   Block location:  Digital block   Block anesthetic:  Lidocaine 1% w/o epi and bupivacaine 0.25% w/o epi   Block injection procedure:  Anatomic landmarks identified, introduced needle, incremental injection, negative aspiration for blood and anatomic landmarks palpated   Block outcome:  Anesthesia achieved Procedure type:    Complexity:   Simple Procedure details:    Incision types:  Stab incision   Scalpel blade:  11   Wound management:  Probed and deloculated and irrigated with saline   Drainage:  Purulent   Drainage amount:  Moderate   Packing materials:  None Post-procedure details:    Patient tolerance of procedure:  Tolerated well, no immediate complications Comments:     Patient is on ASA and Plavix. Risks were discussed prior to procedure. Patient did have greater than typical bruising around incision site, likely due to antiplatelet therapy.   (including critical care time)  Medications Ordered in ED Medications - No data to display   Initial Impression / Assessment and Plan / ED Course  I have reviewed the triage vital signs and the nursing notes.  Pertinent labs & imaging results that were available during my care of the patient were reviewed by me and considered in my medical decision making (see chart for details).     Patient well-appearing in no acute distress.  Patient exhibits likely paronychia of the right ring finger.  Patient is a diabetic, and does work in an environment where she was recently exposed to pathogens.  Tetanus shot is up-to-date.  Differential diagnosis for patient's rash includes contact dermatitis versus psoriasis versus eczematous eruption.  Given that it is chronic times 1 month, patient has previously been evaluated for this, doubt serious pathologies such as SJS-HTN-erythema multiforme.  Rash not consistent with meningococcal rash.  Rash is not in a distribution suggestive of scabies. Will treat with Kenalog ointment.   I&D of paronychia performed today with purulent drainage.  Patient did have bruising secondary to this due to patient's dual antiplatelet therapy, but bleeding was well controlled.  Patient placed on Bactrim.  Patient instructed to return in 2 days for wound check.  I have reviewed the patient's information in the [facility] for  the past 12 months and found them to have no Rx identified.  Opiates were prescribed for an acute, painful condition. The patient was given information on side effects and encouraged to use other, non-opiate pain medication primary, only using opiate medicine sparingly for severe pain.  This is a shared visit with Dr. Dene Gentry. Patient was independently evaluated by this attending physician. Attending physician consulted in evaluation and discharge  management.  Final Clinical Impressions(s) / ED Diagnoses   Final diagnoses:  Paronychia of right ring finger  Rash and nonspecific skin eruption  Elevated blood pressure reading with diagnosis of hypertension    ED Discharge Orders        Ordered    sulfamethoxazole-trimethoprim (BACTRIM DS,SEPTRA DS) 800-160 MG tablet  2 times daily     05/17/17 1302    triamcinolone cream (KENALOG) 0.1 %  2 times daily     05/17/17 1302    HYDROcodone-acetaminophen (NORCO/VICODIN) 5-325 MG tablet  Every 6 hours PRN     05/17/17 1302       Elisha Ponder, PA-C 05/17/17 1326    Wynetta Fines, MD 05/17/17 [phone]

## 2017-05-17 NOTE — Discharge Instructions (Signed)
Please see the information and instructions below regarding your visit.  Your diagnoses today include:  1. Paronychia of right ring finger     Abscesses form when an infection in your skin starts to collect bacteria and white blood cells, walling it off from the rest of your body to protect you from a bigger infection. Risk factors for this type of infection include:  ?Break in the skin ?Diabetes ?Swollen areas  Sometimes the infection starts to spread to surrounding tissue, causing redness and swelling. We call this cellulitis.   Tests performed today include: See side panel of your discharge paperwork for testing performed today. Vital signs are listed at the bottom of these instructions.   Medications prescribed:    Take any prescribed medications only as prescribed, and any over the counter medications only as directed on the packaging.  1. Please take all of your antibiotics until finished.   You may develop abdominal discomfort or nausea from the antibiotic. If this occurs, you may take it with food. Some patients also get diarrhea with antibiotics. You may help offset this with probiotics which you can buy or get in yogurt. Do not eat or take the probiotics until 2 hours after your antibiotic. Some women develop vaginal yeast infections after antibiotics. If you develop unusual vaginal discharge after being on this medication, please see your primary care provider.   Some people develop allergies to antibiotics. Symptoms of antibiotic allergy can be mild and include a flat rash and itching. They can also be more serious and include:  ?Hives - Hives are raised, red patches of skin that are usually very itchy.  ?Lip or tongue swelling  ?Trouble swallowing or breathing  ?Blistering of the skin or mouth.  If you have any of these serious symptoms, please seek emergency medical care immediately.  2. You have been prescribed Norco for pain. This is an opioid pain medication. You  may take this medication every 4-6 hours as needed for pain. Only take this medication if you need it for breakthrough pain.   Do not combine this medication with Tylenol, as it may increase the risk of liver problems.  Do not combine this medication with alcohol.  Please be advised to avoid driving or operating heavy machinery while taking this medication, as it may make you drowsy or impair judgment.    Home care instructions:  Please follow any educational materials contained in this packet.   Some things that may promote healing of your wound and infection include:  Raise your arm or leg to reduce swelling - Raise the arm or leg up above the level of your heart 3 or 4 times a day, for 30 minutes each time. Keep the infected area clean and dry. You can take a shower or bath, but be sure to pat the area dry with a towel afterward. Do not put any antibiotic ointments or creams on the area. Reapply a dry gauze dressing any time the bandage has become soaked with drainage, or after cleansing the wound.  Apply warm compresses to the wound 3-4 times daily to encourage drainage.   Return instructions:  Please return to the Emergency Department if you experience worsening symptoms. You should return for reevaluation of your infection if you notice spreading redness, increased swelling, an abscess develops, or you develop signs and symptoms of a systemic illness such as fever and chills.  Please return if you have any other emergent concerns.  Additional Information:   Your vital  signs today were: BP (!) 176/98 (BP Location: Right Arm)    Pulse 85    Temp 98.1 F (36.7 C) (Oral)    Resp 17    SpO2 95%  If your blood pressure (BP) was elevated on multiple readings during this visit above 130 for the top number or above 80 for the bottom number, please have this repeated by your primary care provider within one month. --------------  Thank you for allowing [place] to participate in your care  today.

## 2017-05-17 NOTE — ED Triage Notes (Signed)
Pt noted to have redness and swelling to right 4th finger. redness is around nail base. No swelling or redness extending up arm.

## 2017-06-01 ENCOUNTER — Encounter: Payer: Self-pay | Admitting: Neurology

## 2017-06-08 ENCOUNTER — Encounter: Payer: Self-pay | Admitting: Cardiovascular Disease

## 2017-06-08 ENCOUNTER — Ambulatory Visit (INDEPENDENT_AMBULATORY_CARE_PROVIDER_SITE_OTHER): Payer: Medicare Other | Admitting: Cardiovascular Disease

## 2017-06-08 VITALS — BP 172/96 | HR 72 | Ht 64.0 in | Wt 211.2 lb

## 2017-06-08 DIAGNOSIS — Z4509 Encounter for adjustment and management of other cardiac device: Secondary | ICD-10-CM

## 2017-06-08 DIAGNOSIS — I10 Essential (primary) hypertension: Secondary | ICD-10-CM

## 2017-06-08 DIAGNOSIS — I639 Cerebral infarction, unspecified: Secondary | ICD-10-CM

## 2017-06-08 DIAGNOSIS — E669 Obesity, unspecified: Secondary | ICD-10-CM | POA: Diagnosis not present

## 2017-06-08 DIAGNOSIS — I739 Peripheral vascular disease, unspecified: Secondary | ICD-10-CM

## 2017-06-08 DIAGNOSIS — E782 Mixed hyperlipidemia: Secondary | ICD-10-CM

## 2017-06-08 DIAGNOSIS — E1169 Type 2 diabetes mellitus with other specified complication: Secondary | ICD-10-CM

## 2017-06-08 MED ORDER — LISINOPRIL 20 MG PO TABS: 20.0000 mg | ORAL_TABLET | Freq: Every day | ORAL | 3 refills | Status: DC

## 2017-06-08 NOTE — Patient Instructions (Signed)
Medication Instructions: Dr Royann Shivers has recommended making the following medication changes: 1. INCREASE Lisinopril to 20 mg daily  Labwork: NONE ORDERED  Testing/Procedures: 1. ABIs - Your physician has requested that you have an ankle brachial index (ABI). During this test an ultrasound and blood pressure cuff are used to evaluate the arteries that supply the arms and legs with blood. Allow thirty minutes for this exam. There are no restrictions or special instructions.  Follow-up: Your physician recommends that you schedule an appointment in 6 weeks with our clinical pharmacists for blood pressure check/medication management.  Dr Royann Shivers recommends that you schedule a follow-up appointment in 3-4 months.  If you need a refill on your cardiac medications before your next appointment, please call your pharmacy.

## 2017-06-08 NOTE — Progress Notes (Signed)
Cardiology Office Note:    Date:  06/10/2017   ID:  Tonya Sanders, DOB 29-Oct-1960, MRN [id number]  PCP:  Patient, No Pcp Per  Cardiologist:  No primary care provider on file. New  Referring MD: Tracey Harries, MD   Chief Complaint  Patient presents with  . Follow-up    Loop recorder for cryptogenic stroke    History of Present Illness:    Tonya Sanders is a 56 y.o. 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  During that hospitalization her name is Tonya Sanders and she also has records under the name Tonya Sanders.  In June 2018, when she presented with her stroke, she underwent a TEE that was mostly normal study.  There was a very small PFO with right-to-left shunt documented by saline contrast, but the lower extremity venous Dopplers did not show evidence of DVT.  CT angiogram and MRI of the head and neck discharged on aspirin and clopidogrel.  Showed an acute infarct in the lateral right cerebellum, a small remote right cerebellar infarct close to the acute 1, remote left PCA territory infarct in the occipital lobe.   She's not had any new neurological events since last year.  Blood pressure is consistently very high.  Today it was 172/96.  In February when she saw her primary care provider in April the pressure was 160/90; lisinopril was increased to 10 mg daily, metoprolol was changed from 50 mg twice daily to metoprolol succinate 50 mg once daily.  Despite those recommendations, it appears that she is only taking 5 mg of lisinopril and is not taking metoprolol at all.  She has had problems with recurrent episodes of hypoglycemia.  Reports that she is no longer taking metformin and that she very rarely needs to use her sliding scale insulin because her fasting glucose is always low.  Last year in June  her hemoglobin A1c was 8.8%, but at her February appointment, hemoglobin A1c was severely elevated at 11.1%.    Other labs were normal including a creatinine of 0.7, potassium 4.9, normal LFTs with the exception of alkaline phosphatase 149, hemoglobin 13.9.  Total cholesterol was 175, triglycerides 227, HDL 44, LDL 86.  Denies exertional angina or exertional dyspnea.  She is quite inactive.  No problems with palpitations, syncope, edema or other neurological development since last June.  She describes bilateral calf pain, but the pattern is not typical for claudication  The initial transmitter for her loop recorder apparently malfunctioned and she was advised that she would receive a new one through the mail, but this did not happen.  Recorder has therefore not been downloaded since October 2018.  We did that today and there have been no recorded events.  The device has never shown atrial fibrillation or any other meaningful abnormality.  Activity level has been fairly steady at about 2 hours a day.  Heart rate variability and day-night variability are preserved.  Past Medical History:  Diagnosis Date  . Anxiety   . Arthritis    "all over" (07/19/2016)  . Asthma   . Bipolar disorder (HCC)   . Chronic bronchitis (HCC)   . Chronic lower back pain   . Depression   . GERD (gastroesophageal reflux disease)   . H. pylori infection   . Hyperlipidemia   . Hypertension   .  Migraine    "a few/month" (07/19/2016)  . Myocardial infarction (HCC) 2016  . Neuropathy   . Neuropathy    Tonya Sanders 07/19/2016  . PUD (peptic ulcer disease)    hx/notes 06/22/2010  . Seizures (HCC)   . Stroke [facility])    "I've had several"; denies residual (07/19/2016)  . Type II diabetes mellitus (HCC)     Past Surgical History:  Procedure Laterality Date  . APPENDECTOMY    . CARDIAC CATHETERIZATION  2016  . CHOLECYSTECTOMY OPEN    . ESOPHAGOGASTRODUODENOSCOPY ENDOSCOPY  07/2005   Tonya Sanders 06/22/2010  . FLEXIBLE  SIGMOIDOSCOPY  07/2005   Tonya Sanders 06/22/2010  . LOOP RECORDER INSERTION N/A 07/26/2016   Procedure: Loop Recorder Insertion;  Surgeon: Marinus Maw, MD;  Location: [facility] INVASIVE CV LAB;  Service: Cardiovascular;  Laterality: N/A;  . TEE WITHOUT CARDIOVERSION N/A 07/26/2016   Procedure: TRANSESOPHAGEAL ECHOCARDIOGRAM (TEE);  Surgeon: Chrystie Nose, MD;  Location: [facility] ENDOSCOPY;  Service: Cardiovascular;  Laterality: N/A;  . TUBAL LIGATION      Current Medications: Current Meds  Medication Sig  . aspirin 81 MG chewable tablet Chew 1 tablet (81 mg total) by mouth daily.  Marland Kitchen atorvastatin (LIPITOR) 40 MG tablet Take 1 tablet (40 mg total) by mouth daily at 6 PM.  . clopidogrel (PLAVIX) 75 MG tablet Take 1 tablet (75 mg total) by mouth daily.  . DULoxetine (CYMBALTA) 60 MG capsule Take 1 capsule (60 mg total) by mouth 2 (two) times daily.  Marland Kitchen HYDROcodone-acetaminophen (NORCO/VICODIN) 5-325 MG tablet Take 1 tablet by mouth every 6 (six) hours as needed.  . hydrocortisone cream 1 % Apply to affected area 2 times daily  . insulin detemir (LEVEMIR) 100 UNIT/ML injection Inject 50 Units into the skin 2 (two) times daily.  . insulin lispro (HUMALOG) 100 UNIT/ML cartridge Inject 0-50 Units into the skin daily. Per Sliding scale. Filled last 04-2016  . lisinopril (PRINIVIL,ZESTRIL) 20 MG tablet Take 1 tablet (20 mg total) by mouth daily.  . Melatonin 5 MG TABS Take 1 tablet (5 mg total) by mouth at bedtime as needed (insomnia).  . metFORMIN (GLUCOPHAGE) 500 MG tablet Take 1 tablet (500 mg total) by mouth 2 (two) times daily with a meal.  . mupirocin cream (BACTROBAN) 2 % Apply 1 application topically 2 (two) times daily.  Marland Kitchen oxyCODONE-acetaminophen (PERCOCET/ROXICET) 5-325 MG tablet Take 1 tablet by mouth every 8 (eight) hours as needed for severe pain.  Marland Kitchen triamcinolone cream (KENALOG) 0.1 % Apply 1 application topically 2 (two) times daily.  . [DISCONTINUED] lisinopril (PRINIVIL,ZESTRIL) 5 MG tablet Take 1  tablet (5 mg total) by mouth daily.     Allergies:   Doxycycline; Penicillins; Erythromycin; Nsaids; and Tomato   Social History   Socioeconomic History  . Marital status: Divorced    Spouse name: Not on file  . Number of children: Not on file  . Years of education: Not on file  . Highest education level: Not on file  Occupational History  . Not on file  Social Needs  . Financial resource strain: Not on file  . Food insecurity:    Worry: Not on file    Inability: Not on file  . Transportation needs:    Medical: Not on file    Non-medical: Not on file  Tobacco Use  . Smoking status: Former Games developer  . Smokeless tobacco: Never Used  . Tobacco comment: 07/19/2016 "just smoke when I get upset"  Substance and Sexual Activity  . Alcohol use:  Yes  . Drug use: No  . Sexual activity: Yes  Lifestyle  . Physical activity:    Days per week: Not on file    Minutes per session: Not on file  . Stress: Not on file  Relationships  . Social connections:    Talks on phone: Not on file    Gets together: Not on file    Attends religious service: Not on file    Active member of club or organization: Not on file    Attends meetings of clubs or organizations: Not on file    Relationship status: Not on file  Other Topics Concern  . Not on file  Social History Narrative  . Not on file     Family History: The patient's family history includes COPD in her mother; Cancer in her mother and sister; Heart attack in her mother and sister; Heart disease in her mother and sister; Heart failure in her mother and sister; Hyperlipidemia in her mother and sister; Hypertension in her mother and sister; Sleep apnea in her sister.  ROS:   Please see the history of present illness.     All other systems reviewed and are negative.  EKGs/Labs/Other Studies Reviewed:    The following studies were reviewed today: Multiple notes from primary care provider and hospitalization in June 2018,  EKG:  EKG is   ordered today.  The ekg ordered today demonstrates normal sinus rhythm, left atrial abnormality, incomplete right bundle branch block, borderline QTC 451 ms  Recent Labs: 07/25/2016: BUN 18; Creatinine, Ser 0.65; Hemoglobin 12.8; Platelets 270; Potassium 3.9; Sodium 138  Recent Lipid Panel    Component Value Date/Time   CHOL 155 07/20/2016 0420   TRIG 250 (H) 07/20/2016 0420   HDL 28 (L) 07/20/2016 0420   CHOLHDL 5.5 07/20/2016 0420   VLDL 50 (H) 07/20/2016 0420   LDLCALC 77 07/20/2016 0420    Physical Exam:    VS:  BP (!) 172/96   Pulse 72   Ht 5\' 4"  (1.626 m)   Wt 211 lb 3.2 oz (95.8 kg)   BMI 36.25 kg/m     Wt Readings from Last 3 Encounters:  06/08/17 211 lb 3.2 oz (95.8 kg)  12/29/16 205 lb (93 kg)  07/19/16 226 lb 6.4 oz (102.7 kg)     GEN: Moderately obese, well nourished, well developed in no acute distress HEENT: Normal NECK: No JVD; No carotid bruits LYMPHATICS: No lymphadenopathy CARDIAC: RRR, no murmurs, rubs, gallops.  Well-healed loop recorder site, but the device has a rather acute angle of entry and sticks out a little bit. RESPIRATORY:  Clear to auscultation without rales, wheezing or rhonchi  ABDOMEN: Soft, non-tender, non-distended MUSCULOSKELETAL:  No edema; No deformity  SKIN: Warm and dry NEUROLOGIC:  Alert and oriented x 3 PSYCHIATRIC:  Normal affect   ASSESSMENT:    1. Essential hypertension   2. Cryptogenic stroke (HCC)   3. Diabetes mellitus type 2 in obese (HCC)   4. Encounter for loop recorder check   5. Claudication (HCC)   6. Mixed hyperlipidemia due to type 2 diabetes mellitus (HCC)    PLAN:    In order of problems listed above:  1. Cryptogenic stroke: Is unexplained.  Bilateral cerebellar and occipital infarcts were recorded, but no evidence of lesions in the anterior circulation.  Loop recorder has not shown any rhythm abnormalities in almost a year of monitoring.  The TEE showed a tiny PFO that is most likely an innocent  bystander.  There was no DVT at the time of presentation.  She is on aspirin and clopidogrel and has not had any interval neurological events. 2. HTN: Poorly controlled.  Is not clear to me why she is no longer taking her metoprolol, nor why she did not implement the recommended increase lisinopril dose.  We will increase her lisinopril today to 20 mg daily and follow-up. 3. DM: Although her hemoglobin A1c was severely elevated at 11.1% in February, she now reports that her glucose is always well controlled and that she actually has frequent spells of hypoglycemia.  This is without taking metformin.  She needs to have another A1c assay.  I think should be an excellent candidate for treatment with an SGLT2 inhibitor, which would provide benefits for weight loss, maybe a lower likelihood of hypoglycemia, long-term cardiovascular benefit.  Asked her to discuss the use of Jardiance or similar agent with her diabetes provider. 4. ILR: Get her a new transmitter and enroll her in her device clinic. 5. Calf pain: Not sure whether this represents claudication.  We will schedule her for ABIs. 6. HLP: So far she has not had any documented vascular disease, but is clearly at high risk of developing coronary or peripheral arterial disease due to poorly controlled hypertension and diabetes mellitus and obesity.  LDL under 100 is the formal acceptable target, but I think is reasonable to shoot for a more stringent target of less than 70.   Medication Adjustments/Labs and Tests Ordered: Current medicines are reviewed at length with the patient today.  Concerns regarding medicines are outlined above.  Orders Placed This Encounter  Procedures  . EKG 12-Lead   Meds ordered this encounter  Medications  . lisinopril (PRINIVIL,ZESTRIL) 20 MG tablet    Sig: Take 1 tablet (20 mg total) by mouth daily.    Dispense:  90 tablet    Refill:  3    Patient Instructions  Medication Instructions: Dr Royann Shivers has recommended  making the following medication changes: 1. INCREASE Lisinopril to 20 mg daily  Labwork: NONE ORDERED  Testing/Procedures: 1. ABIs - Your physician has requested that you have an ankle brachial index (ABI). During this test an ultrasound and blood pressure cuff are used to evaluate the arteries that supply the arms and legs with blood. Allow thirty minutes for this exam. There are no restrictions or special instructions.  Follow-up: Your physician recommends that you schedule an appointment in 6 weeks with our [facility] pharmacists for blood pressure check/medication management.  Dr Royann Shivers recommends that you schedule a follow-up appointment in 3-4 months.  If you need a refill on your cardiac medications before your next appointment, please call your pharmacy.    Signed, Thurmon Fair, MD  06/10/2017 1:41 PM    [facility]

## 2017-06-10 DIAGNOSIS — E1169 Type 2 diabetes mellitus with other specified complication: Secondary | ICD-10-CM | POA: Insufficient documentation

## 2017-06-10 DIAGNOSIS — Z4509 Encounter for adjustment and management of other cardiac device: Secondary | ICD-10-CM | POA: Insufficient documentation

## 2017-06-10 DIAGNOSIS — E782 Mixed hyperlipidemia: Secondary | ICD-10-CM

## 2017-06-12 ENCOUNTER — Emergency Department (HOSPITAL_COMMUNITY)
Admission: EM | Admit: 2017-06-12 | Discharge: 2017-06-12 | Disposition: A | Discharge status: Home / Self Care | Payer: Medicare Other | Attending: Emergency Medicine | Admitting: Emergency Medicine

## 2017-06-12 ENCOUNTER — Encounter (HOSPITAL_COMMUNITY): Payer: Self-pay

## 2017-06-12 ENCOUNTER — Other Ambulatory Visit: Payer: Self-pay

## 2017-06-12 DIAGNOSIS — Z87891 Personal history of nicotine dependence: Secondary | ICD-10-CM | POA: Insufficient documentation

## 2017-06-12 DIAGNOSIS — J45909 Unspecified asthma, uncomplicated: Secondary | ICD-10-CM | POA: Diagnosis not present

## 2017-06-12 DIAGNOSIS — Z7982 Long term (current) use of aspirin: Secondary | ICD-10-CM | POA: Diagnosis not present

## 2017-06-12 DIAGNOSIS — Z794 Long term (current) use of insulin: Secondary | ICD-10-CM | POA: Insufficient documentation

## 2017-06-12 DIAGNOSIS — I10 Essential (primary) hypertension: Secondary | ICD-10-CM | POA: Diagnosis not present

## 2017-06-12 DIAGNOSIS — R21 Rash and other nonspecific skin eruption: Secondary | ICD-10-CM | POA: Diagnosis present

## 2017-06-12 DIAGNOSIS — Z79899 Other long term (current) drug therapy: Secondary | ICD-10-CM | POA: Diagnosis not present

## 2017-06-12 DIAGNOSIS — E119 Type 2 diabetes mellitus without complications: Secondary | ICD-10-CM | POA: Diagnosis not present

## 2017-06-12 LAB — CBG MONITORING, ED: Glucose-Capillary: 79 mg/dL (ref 65–99)

## 2017-06-12 MED ORDER — HYDROXYZINE HCL 25 MG PO TABS: 12.5000 mg | ORAL_TABLET | Freq: Three times a day (TID) | ORAL | 0 refills | Status: AC | PRN

## 2017-06-12 MED ORDER — TRIAMCINOLONE ACETONIDE 0.1 % EX CREA: 1.0000 "application " | TOPICAL_CREAM | Freq: Two times a day (BID) | CUTANEOUS | 0 refills | Status: AC

## 2017-06-12 NOTE — Discharge Instructions (Addendum)
Please use steroid cream as directed. Please follow with your primary care doctor and discuss referral to dermatologist Please take Atarax as needed for itching.  Please note this medication can make you drowsy Please return if you have any fever, swelling of your lips, tongue or throat; difficulty swallowing, difficulty breathing, chest tightness, abdominal cramping, vomiting or diarrhea, increased swelling of your lower leg, or change in your rash that is concerning.

## 2017-06-12 NOTE — ED Triage Notes (Signed)
Pt has rash to the back of her right leg. She states it started as a couple of bumps but has progressively gotten bigger and more swollen over the last 2 weeks. Pt had a similar rash on her left arm recently.

## 2017-06-12 NOTE — ED Provider Notes (Addendum)
[facility] EMERGENCY DEPARTMENT Provider Note   CSN: [id number] Arrival date & time: 06/12/17  4098     History   Chief Complaint Chief Complaint  Patient presents with  . Rash    HPI Tonya Sanders is a 57 y.o. female with a past medical history of MI, CVA, type 2 diabetes, bipolar disorder, seizures who presents the emergency department today for rash on her posterior right calf.  Patient notes that this pain is been present for approximately 2 weeks.  She notes that she previously had similar rash that she was seen here for and was treated with steroid cream with resolution (she is now out of that medication).  She describes the area as pruritic and at times burning in nature.  She has been applying Neosporin to the area twice daily without relief. Denies fever, chills, contacts with persons with similar rash, or any changes in lotions/soaps/detergents, exposure to animal or plant irritants, and denies swelling or purulent discharge. No new medications. No recent travel. No recent tick bites. No involvement to palms/soles or between webspaces.  No difficulty swallowing, inability to control secretions, lip swelling, tongue swelling, shortness of breath, difficulty breathing, chest tightness, abdominal cramping, vomiting or diarrhea  HPI  Past Medical History:  Diagnosis Date  . Anxiety   . Arthritis    "all over" (07/19/2016)  . Asthma   . Bipolar disorder (HCC)   . Chronic bronchitis (HCC)   . Chronic lower back pain   . Depression   . GERD (gastroesophageal reflux disease)   . H. pylori infection   . Hyperlipidemia   . Hypertension   . Migraine    "a few/month" (07/19/2016)  . Myocardial infarction (HCC) 2016  . Neuropathy   . Neuropathy    Hattie Perch 07/19/2016  . PUD (peptic ulcer disease)    hx/notes 06/22/2010  . Seizures (HCC)   . Stroke [facility])    "I've had several"; denies residual (07/19/2016)  . Type II diabetes mellitus [facility])     Patient Active  Problem List   Diagnosis Date Noted  . Mixed hyperlipidemia due to type 2 diabetes mellitus (HCC) 06/10/2017  . Encounter for loop recorder check 06/10/2017  . Hypertension 07/25/2016  . Hyperlipidemia 07/25/2016  . Diabetes mellitus type 2 in obese (HCC) 07/25/2016  . Seizures (HCC) 07/25/2016  . Cerebellar stroke (HCC)   . 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 APPENDECTOMY    . CARDIAC CATHETERIZATION  2016  . CHOLECYSTECTOMY OPEN    . ESOPHAGOGASTRODUODENOSCOPY ENDOSCOPY  07/2005   Hattie Perch 06/22/2010  . FLEXIBLE SIGMOIDOSCOPY  07/2005   Hattie Perch 06/22/2010  . LOOP RECORDER INSERTION N/A 07/26/2016   Procedure: Loop Recorder Insertion;  Surgeon: Marinus Maw, MD;  Location: [facility] INV[facility] CV LAB;  Service: Cardiovascular;  Laterality: N/A;  . TEE WITHOUT CARDIOVERSION N/A 07/26/2016   Procedure: TRANSESOPHAGEAL ECHOCARDIOGRAM (TEE);  Surgeon: Chrystie Nose, MD;  Location: [facility] ENDOSCOPY;  Service: Cardiovascular;  Laterality: N/A;  . TUBAL LIGATION       OB History   None      Home Medications    Prior to Admission medications   Medication Sig Start Date End Date Taking? Authorizing Provider  aspirin 81 MG chewable tablet Chew 1 tablet (81 mg total) by mouth daily. 07/22/16   Darreld Mclean, MD  atorvastatin (LIPITOR) 40 MG tablet Take 1 tablet (40 mg total) by mouth daily at 6 PM.  07/21/16   Darreld Mclean, MD  clopidogrel (PLAVIX) 75 MG tablet Take 1 tablet (75 mg total) by mouth daily. 07/22/16   Darreld Mclean, MD  DULoxetine (CYMBALTA) 60 MG capsule Take 1 capsule (60 mg total) by mouth 2 (two) times daily. 07/21/16   Darreld Mclean, MD  HYDROcodone-acetaminophen (NORCO/VICODIN) 5-325 MG tablet Take 1 tablet by mouth every 6 (six) hours as needed. 05/17/17   Aviva Kluver B, PA-C  hydrocortisone cream 1 % Apply to affected area 2 times daily 04/11/17   Graciella Freer A, PA-C  insulin detemir (LEVEMIR) 100 UNIT/ML injection  Inject 50 Units into the skin 2 (two) times daily.    [provider]  insulin lispro (HUMALOG) 100 UNIT/ML cartridge Inject 0-50 Units into the skin daily. Per Sliding scale. Filled last 04-2016    [provider]  lisinopril (PRINIVIL,ZESTRIL) 20 MG tablet Take 1 tablet (20 mg total) by mouth daily. 06/08/17   Croitoru, Mihai, MD  Melatonin 5 MG TABS Take 1 tablet (5 mg total) by mouth at bedtime as needed (insomnia). 07/26/16   Molt, Bethany, DO  metFORMIN (GLUCOPHAGE) 500 MG tablet Take 1 tablet (500 mg total) by mouth 2 (two) times daily with a meal. 07/26/16 07/26/17  Molt, Bethany, DO  mupirocin cream (BACTROBAN) 2 % Apply 1 application topically 2 (two) times daily. 04/11/17   Maxwell Caul, PA-C  oxyCODONE-acetaminophen (PERCOCET/ROXICET) 5-325 MG tablet Take 1 tablet by mouth every 8 (eight) hours as needed for severe pain. 12/29/16   Fawze, Mina A, PA-C  triamcinolone cream (KENALOG) 0.1 % Apply 1 application topically 2 (two) times daily. 05/17/17   Elisha Ponder, PA-C    Family History Family History  Problem Relation Age of Onset  . Heart disease Mother   . Heart attack Mother   . Heart failure Mother   . Hyperlipidemia Mother   . Hypertension Mother   . Cancer Mother   . COPD Mother   . Heart attack Sister   . Heart disease Sister   . Heart failure Sister   . Hyperlipidemia Sister   . Hypertension Sister   . Cancer Sister   . Sleep apnea Sister     Social History Social History   Tobacco Use  . Smoking status: Former Games developer  . Smokeless tobacco: Never Used  . Tobacco comment: 07/19/2016 "just smoke when I get upset"  Substance Use Topics  . Alcohol use: Yes  . Drug use: No     Allergies   Doxycycline; Penicillins; Erythromycin; Nsaids; and Tomato   Review of Systems Review of Systems  All other systems reviewed and are negative.    Physical Exam Updated Vital Signs BP (!) 156/95 (BP Location: Right Arm)   Pulse 80   Temp 98.4 F  (36.9 C) (Oral)   Resp 18   SpO2 100%   Physical Exam  Constitutional: She appears well-developed and well-nourished.  HENT:  Head: Normocephalic and atraumatic.  Right Ear: External ear normal.  Left Ear: External ear normal.  Nose: Nose normal.  Mouth/Throat: Uvula is midline, oropharynx is clear and moist and mucous membranes are normal. No tonsillar exudate.  Patient in control of her secretions with normal phonation.  Dentures present.  No oral lesions.  No angioedema.  No lip swelling, tongue swelling or uvular edema.  No trismus.  Eyes: Pupils are equal, round, and reactive to light. Right eye exhibits no discharge. Left eye exhibits no discharge. No scleral icterus.  Neck: Trachea normal. Neck  supple. No spinous process tenderness present. No neck rigidity. Normal range of motion present.  Cardiovascular: Normal rate, regular rhythm and intact distal pulses.  No murmur heard. Pulses:      Radial pulses are 2+ on the right side, and 2+ on the left side.       Dorsalis pedis pulses are 2+ on the right side, and 2+ on the left side.       Posterior tibial pulses are 2+ on the right side, and 2+ on the left side.  No lower extremity swelling or edema. Calves symmetric in size bilaterally.  No tenderness palpation of the calves.  Pulmonary/Chest: Effort normal and breath sounds normal. She exhibits no tenderness.  No increased work of breathing. No accessory muscle use. Patient is sitting upright, speaking in full sentences without difficulty   Abdominal: Soft. Bowel sounds are normal. There is no tenderness. There is no rebound and no guarding.  Musculoskeletal: She exhibits no edema.  Lymphadenopathy:    She has no cervical adenopathy.  Neurological: She is alert.  Skin: Skin is warm and dry. Capillary refill takes less than 2 seconds. Rash noted. She is not diaphoretic.  She was scaly, erythematous rash as previously seen in prior notes.  This measures 6 cm x 10 cm. No blisters,  no warmth, no draining sinus tracts, no superficial abscesses, no bullous impetigo, no vesicles, no desquamation, no target lesions with dusky purpura or a central bulla. Not tender to touch.  No burrows or tunneling between webspaces.  Psychiatric: She has a normal mood and affect.  Nursing note and vitals reviewed.    ED Treatments / Results  Labs (all labs ordered are listed, but only abnormal results are displayed) Labs Reviewed  CBG MONITORING, ED    EKG None  Radiology No results found.  Procedures Procedures (including critical care time)  Medications Ordered in ED Medications - No data to display   Initial Impression / Assessment and Plan / ED Course  I have reviewed the triage vital signs and the nursing notes.  Pertinent labs & imaging results that were available during my care of the patient were reviewed by me and considered in my medical decision making (see chart for details).     57 y.o. female with recurrent rash to the right lower leg. Notes previously tx with steroid cream with relief. Rash consistent with contact dermatitis versus psoriasis versus eczema eruption. Patient denies any difficulty breathing or swallowing.  Pt has a patent airway without stridor and is handling secretions without difficulty; no angioedema. No blisters, no pustules, no warmth, no draining sinus tracts, no superficial abscesses, no bullous impetigo, no vesicles, no desquamation, no target lesions with dusky purpura or a central bulla. Not tender to touch. No concern for superimposed infection. No concern for SJS, TEN, TSS, tick borne illness, syphilis or other life-threatening condition.  Patient without lesions that appear to be related to scabies. Discharged home with triamcinolone cream.  I discussed with the patient that she will likely need to establish care with a dermatologist if this persists.  I recommended that she follow-up with her PCP so she can be referred to a  dermatologist. I advised the patient to follow-up with PCP ([facility]) this week. Specific return precautions discussed. Time was given for all questions to be answered. The patient verbalized understanding and agreement with plan. The patient appears safe for discharge home.   Final Clinical Impressions(s) / ED Diagnoses   Final diagnoses:  Rash  ED Discharge Orders        Ordered    triamcinolone cream (KENALOG) 0.1 %  2 times daily     06/12/17 1014       Jacinto Halim, PA-C 06/12/17 1016    Jacinto Halim, PA-C 06/12/17 1016    Loren Racer, MD 06/14/17 [phone]

## 2017-06-19 ENCOUNTER — Ambulatory Visit (HOSPITAL_COMMUNITY)
Admission: RE | Admit: 2017-06-19 | Discharge: 2017-06-19 | Disposition: A | Discharge status: Home / Self Care | Payer: Medicare Other | Source: Ambulatory Visit | Attending: Cardiovascular Disease | Admitting: Cardiovascular Disease

## 2017-06-19 DIAGNOSIS — I739 Peripheral vascular disease, unspecified: Secondary | ICD-10-CM | POA: Insufficient documentation

## 2017-06-21 ENCOUNTER — Telehealth: Payer: Self-pay | Admitting: Cardiology

## 2017-06-21 NOTE — Telephone Encounter (Signed)
LMOVM requesting that pt send manual transmission b/c home monitor has not updated in at least 14 days.    

## 2017-06-25 ENCOUNTER — Telehealth: Payer: Self-pay | Admitting: Cardiovascular Disease

## 2017-06-25 NOTE — Telephone Encounter (Signed)
New message   1. Has your device fired? NO  2. Is you device beeping? NO  3. Are you experiencing draining or swelling at device site? NO  4. Are you calling to see if we received your device transmission? Patient states she sent monitor back to company, has not received new monitor  5. Have you passed out? NO    Please route to Device Clinic Pool

## 2017-06-25 NOTE — Telephone Encounter (Signed)
Spoke with [employer]--no monitor was ordered for patient.  Monitor successfully ordered today and will should arrive at patient's home address in 5-7 business days.  LMOVM requesting return call from patient to make her aware.  Gave direct number.

## 2017-06-27 ENCOUNTER — Encounter: Payer: Self-pay | Admitting: Cardiology

## 2017-06-27 NOTE — Telephone Encounter (Signed)
Informed patient that her monitor was ordered on Monday, and that she will receive it within 5-7 business days. Patient verbalized understanding.

## 2017-07-01 ENCOUNTER — Emergency Department (HOSPITAL_COMMUNITY)
Admission: EM | Admit: 2017-07-01 | Discharge: 2017-07-01 | Disposition: A | Discharge status: Home / Self Care | Payer: Medicare Other | Attending: Emergency Medicine | Admitting: Emergency Medicine

## 2017-07-01 ENCOUNTER — Other Ambulatory Visit: Payer: Self-pay

## 2017-07-01 ENCOUNTER — Encounter (HOSPITAL_COMMUNITY): Payer: Self-pay | Admitting: Emergency Medicine

## 2017-07-01 DIAGNOSIS — Z7902 Long term (current) use of antithrombotics/antiplatelets: Secondary | ICD-10-CM | POA: Diagnosis not present

## 2017-07-01 DIAGNOSIS — J45909 Unspecified asthma, uncomplicated: Secondary | ICD-10-CM | POA: Insufficient documentation

## 2017-07-01 DIAGNOSIS — I252 Old myocardial infarction: Secondary | ICD-10-CM | POA: Diagnosis not present

## 2017-07-01 DIAGNOSIS — Z87891 Personal history of nicotine dependence: Secondary | ICD-10-CM | POA: Diagnosis not present

## 2017-07-01 DIAGNOSIS — Z794 Long term (current) use of insulin: Secondary | ICD-10-CM | POA: Diagnosis not present

## 2017-07-01 DIAGNOSIS — R1084 Generalized abdominal pain: Secondary | ICD-10-CM | POA: Insufficient documentation

## 2017-07-01 DIAGNOSIS — E119 Type 2 diabetes mellitus without complications: Secondary | ICD-10-CM | POA: Diagnosis not present

## 2017-07-01 DIAGNOSIS — R197 Diarrhea, unspecified: Secondary | ICD-10-CM

## 2017-07-01 DIAGNOSIS — I10 Essential (primary) hypertension: Secondary | ICD-10-CM | POA: Diagnosis not present

## 2017-07-01 DIAGNOSIS — Z7982 Long term (current) use of aspirin: Secondary | ICD-10-CM | POA: Diagnosis not present

## 2017-07-01 LAB — COMPREHENSIVE METABOLIC PANEL
ALT: 18 U/L (ref 14–54)
ANION GAP: 9 (ref 5–15)
AST: 19 U/L (ref 15–41)
Albumin: 3.2 g/dL — ABNORMAL LOW (ref 3.5–5.0)
Alkaline Phosphatase: 88 U/L (ref 38–126)
BUN: 23 mg/dL — ABNORMAL HIGH (ref 6–20)
CO2: 24 mmol/L (ref 22–32)
CREATININE: 0.9 mg/dL (ref 0.44–1.00)
Calcium: 9.2 mg/dL (ref 8.9–10.3)
Chloride: 108 mmol/L (ref 101–111)
Glucose, Bld: 211 mg/dL — ABNORMAL HIGH (ref 65–99)
POTASSIUM: 3.3 mmol/L — AB (ref 3.5–5.1)
Sodium: 141 mmol/L (ref 135–145)
Total Bilirubin: 0.4 mg/dL (ref 0.3–1.2)
Total Protein: 6.5 g/dL (ref 6.5–8.1)

## 2017-07-01 LAB — URINALYSIS, ROUTINE W REFLEX MICROSCOPIC
Bilirubin Urine: NEGATIVE
Glucose, UA: 150 mg/dL — AB
Hgb urine dipstick: NEGATIVE
Ketones, ur: NEGATIVE mg/dL
LEUKOCYTES UA: NEGATIVE
Nitrite: NEGATIVE
Protein, ur: NEGATIVE mg/dL
Specific Gravity, Urine: 1.024 (ref 1.005–1.030)
pH: 5 (ref 5.0–8.0)

## 2017-07-01 LAB — LIPASE, BLOOD: Lipase: 21 U/L (ref 11–51)

## 2017-07-01 LAB — I-STAT BETA HCG BLOOD, ED (MC, WL, AP ONLY): I-stat hCG, quantitative: <5 m[IU]/mL (ref <5)

## 2017-07-01 LAB — CBC
HEMATOCRIT: 38.5 % (ref 36.0–46.0)
Hemoglobin: 12.7 g/dL (ref 12.0–15.0)
MCH: 31.3 pg (ref 26.0–34.0)
MCHC: 33 g/dL (ref 30.0–36.0)
MCV: 94.8 fL (ref 78.0–100.0)
PLATELETS: 315 10*3/uL (ref 150–400)
RBC: 4.06 MIL/uL (ref 3.87–5.11)
RDW: 12.3 % (ref 11.5–15.5)
WBC: 10.5 10*3/uL (ref 4.0–10.5)

## 2017-07-01 MED ORDER — LOPERAMIDE HCL 2 MG PO CAPS: 4.0000 mg | ORAL_CAPSULE | ORAL | 0 refills | Status: DC | PRN

## 2017-07-01 MED ORDER — LOPERAMIDE HCL 2 MG PO CAPS: 4.0000 mg | ORAL_CAPSULE | Freq: Every evening | ORAL | 0 refills | Status: AC | PRN

## 2017-07-01 MED ORDER — LACTATED RINGERS IV BOLUS
1000.0000 mL | Freq: Once | INTRAVENOUS | Status: AC
  Administered 2017-07-01: 1000 mL via INTRAVENOUS

## 2017-07-01 NOTE — Discharge Instructions (Addendum)
We saw you in the ER for diarrhea. Results in the ER shows that your electrolytes are normal and that there is no blood in your stools. We suspect that your diarrhea will get better by itself.  Take the medication prescribed only at nighttime or if the symptoms get worse.  See your doctors if the diarrhea continue beyond 2 weeks. Return to the ER if you start having bloody stools or fevers.

## 2017-07-01 NOTE — ED Provider Notes (Signed)
[facility] EMERGENCY DEPARTMENT Provider Note   CSN: [id number] Arrival date & time: 07/01/17  1541     History   Chief Complaint Chief Complaint  Patient presents with  . Diarrhea    HPI Tonya Sanders is a 57 y.o. female.  HPI  57 year old comes in with chief complaint of diarrhea.  Patient has multiple medical comorbidities, including peptic ulcer disease, CAD, GERD and chronic back pain. Patient states that her diarrhea started about 1 week ago.  She gets about 5-10 episodes of loose, watery bowel movements.  She has noted some dark tarry stools intermittently mixed with her bloody stools.  Past Medical History:  Diagnosis Date  . Anxiety   . Arthritis    "all over" (07/19/2016)  . Asthma   . Bipolar disorder (HCC)   . Chronic bronchitis (HCC)   . Chronic lower back pain   . Depression   . GERD (gastroesophageal reflux disease)   . H. pylori infection   . Hyperlipidemia   . Hypertension   . Migraine    "a few/month" (07/19/2016)  . Myocardial infarction (HCC) 2016  . Neuropathy   . Neuropathy    Hattie Perch 07/19/2016  . PUD (peptic ulcer disease)    hx/notes 06/22/2010  . Seizures (HCC)   . Stroke [facility])    "I've had several"; denies residual (07/19/2016)  . Type II diabetes mellitus [facility])     Patient Active Problem List   Diagnosis Date Noted  . Mixed hyperlipidemia due to type 2 diabetes mellitus (HCC) 06/10/2017  . Encounter for loop recorder check 06/10/2017  . Hypertension 07/25/2016  . Hyperlipidemia 07/25/2016  . Diabetes mellitus type 2 in obese (HCC) 07/25/2016  . Seizures (HCC) 07/25/2016  . Cerebellar stroke (HCC)   . 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 APPENDECTOMY    . CARDIAC CATHETERIZATION  2016  . CHOLECYSTECTOMY OPEN    . ESOPHAGOGASTRODUODENOSCOPY ENDOSCOPY  07/2005   Hattie Perch 06/22/2010  . FLEXIBLE SIGMOIDOSCOPY  07/2005   Hattie Perch 06/22/2010  . LOOP RECORDER  INSERTION N/A 07/26/2016   Procedure: Loop Recorder Insertion;  Surgeon: Marinus Maw, MD;  Location: [facility] INVASIVE CV LAB;  Service: Cardiovascular;  Laterality: N/A;  . TEE WITHOUT CARDIOVERSION N/A 07/26/2016   Procedure: TRANSESOPHAGEAL ECHOCARDIOGRAM (TEE);  Surgeon: Chrystie Nose, MD;  Location: [facility] ENDOSCOPY;  Service: Cardiovascular;  Laterality: N/A;  . TUBAL LIGATION       OB History   None      Home Medications    Prior to Admission medications   Medication Sig Start Date End Date Taking? Authorizing Provider  aspirin 81 MG chewable tablet Chew 1 tablet (81 mg total) by mouth daily. 07/22/16   Darreld Mclean, MD  atorvastatin (LIPITOR) 40 MG tablet Take 1 tablet (40 mg total) by mouth daily at 6 PM. 07/21/16   Darreld Mclean, MD  clopidogrel (PLAVIX) 75 MG tablet Take 1 tablet (75 mg total) by mouth daily. 07/22/16   Darreld Mclean, MD  DULoxetine (CYMBALTA) 60 MG capsule Take 1 capsule (60 mg total) by mouth 2 (two) times daily. 07/21/16   Darreld Mclean, MD  HYDROcodone-acetaminophen (NORCO/VICODIN) 5-325 MG tablet Take 1 tablet by mouth every 6 (six) hours as needed. 05/17/17   Aviva Kluver B, PA-C  hydrocortisone cream 1 % Apply to affected area 2 times daily 04/11/17   Graciella Freer A, PA-C  hydrOXYzine (ATARAX/VISTARIL) 25 MG tablet Take 0.5-1 tablets (  12.5-25 mg total) by mouth every 8 (eight) hours as needed for itching. 06/12/17   Maczis, Elmer Sow, PA-C  insulin detemir (LEVEMIR) 100 UNIT/ML injection Inject 50 Units into the skin 2 (two) times daily.    [provider]  insulin lispro (HUMALOG) 100 UNIT/ML cartridge Inject 0-50 Units into the skin daily. Per Sliding scale. Filled last 04-2016    [provider]  lisinopril (PRINIVIL,ZESTRIL) 20 MG tablet Take 1 tablet (20 mg total) by mouth daily. 06/08/17   Croitoru, Mihai, MD  loperamide (IMODIUM) 2 MG capsule Take 2 capsules (4 mg total) by mouth at bedtime as needed for diarrhea or loose stools. 07/01/17    Derwood Kaplan, MD  Melatonin 5 MG TABS Take 1 tablet (5 mg total) by mouth at bedtime as needed (insomnia). 07/26/16   Molt, Bethany, DO  metFORMIN (GLUCOPHAGE) 500 MG tablet Take 1 tablet (500 mg total) by mouth 2 (two) times daily with a meal. 07/26/16 07/26/17  Molt, Bethany, DO  mupirocin cream (BACTROBAN) 2 % Apply 1 application topically 2 (two) times daily. 04/11/17   Maxwell Caul, PA-C  oxyCODONE-acetaminophen (PERCOCET/ROXICET) 5-325 MG tablet Take 1 tablet by mouth every 8 (eight) hours as needed for severe pain. 12/29/16   Fawze, Mina A, PA-C  triamcinolone cream (KENALOG) 0.1 % Apply 1 application topically 2 (two) times daily. 06/12/17   Maczis, Elmer Sow, PA-C    Family History Family History  Problem Relation Age of Onset  . Heart disease Mother   . Heart attack Mother   . Heart failure Mother   . Hyperlipidemia Mother   . Hypertension Mother   . Cancer Mother   . COPD Mother   . Heart attack Sister   . Heart disease Sister   . Heart failure Sister   . Hyperlipidemia Sister   . Hypertension Sister   . Cancer Sister   . Sleep apnea Sister     Social History Social History   Tobacco Use  . Smoking status: Former Games developer  . Smokeless tobacco: Never Used  . Tobacco comment: 07/19/2016 "just smoke when I get upset"  Substance Use Topics  . Alcohol use: Yes  . Drug use: No     Allergies   Doxycycline; Penicillins; Erythromycin; Nsaids; and Tomato   Review of Systems Review of Systems  Constitutional: Positive for activity change.  Gastrointestinal: Positive for abdominal pain and diarrhea. Negative for nausea and vomiting.  Allergic/Immunologic: Negative for immunocompromised state.  Neurological: Negative for light-headedness.  Hematological: Does not bruise/bleed easily.     Physical Exam Updated Vital Signs BP (!) 143/75   Pulse 81   Temp 98.1 F (36.7 C) (Oral)   Resp 15   SpO2 97%   Physical Exam  Constitutional: She is oriented to person,  place, and time. She appears well-developed.  HENT:  Head: Normocephalic and atraumatic.  Eyes: EOM are normal.  Neck: Normal range of motion. Neck supple.  Cardiovascular: Normal rate.  Pulmonary/Chest: Effort normal.  Abdominal: Soft. Bowel sounds are normal. She exhibits no distension. There is no tenderness. There is no guarding.  Hemoccult negative  Neurological: She is alert and oriented to person, place, and time.  Skin: Skin is warm and dry.  Nursing note and vitals reviewed.    ED Treatments / Results  Labs (all labs ordered are listed, but only abnormal results are displayed) Labs Reviewed  COMPREHENSIVE METABOLIC PANEL - Abnormal; Notable for the following components:      Result Value  Potassium 3.3 (*)    Glucose, Bld 211 (*)    BUN 23 (*)    Albumin 3.2 (*)    All other components within normal limits  URINALYSIS, ROUTINE W REFLEX MICROSCOPIC - Abnormal; Notable for the following components:   APPearance HAZY (*)    Glucose, UA 150 (*)    All other components within normal limits  LIPASE, BLOOD  CBC  I-STAT BETA HCG BLOOD, ED ([facility], [facility], [facility] ONLY)  POC OCCULT BLOOD, ED    EKG None  Radiology No results found.  Procedures Procedures (including critical care time)  Medications Ordered in ED Medications  lactated ringers bolus 1,000 mL (0 mLs Intravenous Stopped 07/01/17 2134)     Initial Impression / Assessment and Plan / ED Course  I have reviewed the triage vital signs and the nursing notes.  Pertinent labs & imaging results that were available during my care of the patient were reviewed by me and considered in my medical decision making (see chart for details).     57 year old female comes in with chief complaint of diarrhea. Patient's exam is not concerning.  Rectal exam did not reveal melena and is Hemoccult negative.  Hemoglobin is also within normal limits. Patient denies any recent antibiotic use, she has no abdominal pain or fevers.  No  clinical concerns for C. difficile colitis or bacterial etiology for her diarrhea.  We will treat conservatively with as needed loperamide.  Final Clinical Impressions(s) / ED Diagnoses   Final diagnoses:  Diarrhea, unspecified type    ED Discharge Orders        Ordered    loperamide (IMODIUM) 2 MG capsule  As needed,   Status:  Discontinued     07/01/17 2134    loperamide (IMODIUM) 2 MG capsule  At bedtime PRN     07/01/17 2135       Derwood Kaplan, MD 07/01/17 2329

## 2017-07-01 NOTE — ED Triage Notes (Signed)
Pt c/o diarrhea x 7 days. Denies abdominal pain.

## 2017-07-03 ENCOUNTER — Other Ambulatory Visit: Payer: Self-pay

## 2017-07-03 ENCOUNTER — Encounter (HOSPITAL_COMMUNITY): Payer: Self-pay | Admitting: Emergency Medicine

## 2017-07-03 ENCOUNTER — Emergency Department (HOSPITAL_COMMUNITY)
Admission: EM | Admit: 2017-07-03 | Discharge: 2017-07-03 | Disposition: A | Discharge status: Home / Self Care | Payer: Medicare Other | Attending: Emergency Medicine | Admitting: Emergency Medicine

## 2017-07-03 ENCOUNTER — Ambulatory Visit (INDEPENDENT_AMBULATORY_CARE_PROVIDER_SITE_OTHER): Payer: Medicare Other | Admitting: *Deleted

## 2017-07-03 DIAGNOSIS — Z7982 Long term (current) use of aspirin: Secondary | ICD-10-CM | POA: Insufficient documentation

## 2017-07-03 DIAGNOSIS — J45909 Unspecified asthma, uncomplicated: Secondary | ICD-10-CM | POA: Insufficient documentation

## 2017-07-03 DIAGNOSIS — Z79899 Other long term (current) drug therapy: Secondary | ICD-10-CM | POA: Insufficient documentation

## 2017-07-03 DIAGNOSIS — Z87891 Personal history of nicotine dependence: Secondary | ICD-10-CM | POA: Diagnosis not present

## 2017-07-03 DIAGNOSIS — Z7901 Long term (current) use of anticoagulants: Secondary | ICD-10-CM | POA: Diagnosis not present

## 2017-07-03 DIAGNOSIS — I639 Cerebral infarction, unspecified: Secondary | ICD-10-CM

## 2017-07-03 DIAGNOSIS — E119 Type 2 diabetes mellitus without complications: Secondary | ICD-10-CM | POA: Diagnosis not present

## 2017-07-03 DIAGNOSIS — L02234 Carbuncle of groin: Secondary | ICD-10-CM | POA: Diagnosis not present

## 2017-07-03 DIAGNOSIS — I10 Essential (primary) hypertension: Secondary | ICD-10-CM | POA: Insufficient documentation

## 2017-07-03 DIAGNOSIS — R197 Diarrhea, unspecified: Secondary | ICD-10-CM | POA: Diagnosis present

## 2017-07-03 LAB — POC OCCULT BLOOD, ED: Fecal Occult Bld: NEGATIVE

## 2017-07-03 MED ORDER — LIDOCAINE HCL (PF) 1 % IJ SOLN
INTRAMUSCULAR | Status: AC
  Administered 2017-07-03: 13:00:00
  Filled 2017-07-03: qty 30

## 2017-07-03 NOTE — ED Notes (Signed)
D/c reviewed with patient 

## 2017-07-03 NOTE — ED Provider Notes (Signed)
[facility] EMERGENCY DEPARTMENT Provider Note   CSN: [id number] Arrival date & time: 07/03/17  0807     History   Chief Complaint Chief Complaint  Patient presents with  . Recurrent Skin Infections    HPI Ramsey D Sharrar is a 57 y.o. female.  HPI   Patient is a 57 y.o. female with a history of hypertension, cryptogenic stroke, T2 DM, MI presenting for abscess of groin and diarrhea for 7 days. Patient reports that she noticed the abscess over the past 3 days, and noticed some spontaneous purulent drainage yesterday.  Patient denies any redness spreading from the site.  Patient denies any fevers, chills, nausea, vomiting, abdominal pain.  Patient reports that diarrhea began 1 week ago and she has had up to 12 episodes a day.  Patient describes stool as dark in color.  This is not associate with abdominal pain, nausea, or vomiting.  Patient denies seeing blood in the stool.  Patient reports that she had a course of antibiotics in the middle of April, approximately 6 weeks ago for paronychia.  Patient reports that she was prescribed Imodium for the diarrhea, but it has persisted.  Patient denies dizziness, lightheadedness, presyncope or syncope, or decreased urine output.  Past Medical History:  Diagnosis Date  . Anxiety   . Arthritis    "all over" (07/19/2016)  . Asthma   . Bipolar disorder (HCC)   . Chronic bronchitis (HCC)   . Chronic lower back pain   . Depression   . GERD (gastroesophageal reflux disease)   . H. pylori infection   . Hyperlipidemia   . Hypertension   . Migraine    "a few/month" (07/19/2016)  . Myocardial infarction (HCC) 2016  . Neuropathy   . Neuropathy    Hattie Perch 07/19/2016  . PUD (peptic ulcer disease)    hx/notes 06/22/2010  . Seizures (HCC)   . Stroke [facility])    "I've had several"; denies residual (07/19/2016)  . Type II diabetes mellitus [facility])     Patient Active Problem List   Diagnosis Date Noted  . Mixed hyperlipidemia due to type  2 diabetes mellitus (HCC) 06/10/2017  . Encounter for loop recorder check 06/10/2017  . Hypertension 07/25/2016  . Hyperlipidemia 07/25/2016  . Diabetes mellitus type 2 in obese (HCC) 07/25/2016  . Seizures (HCC) 07/25/2016  . Cerebellar stroke (HCC)   . 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 APPENDECTOMY    . CARDIAC CATHETERIZATION  2016  . CHOLECYSTECTOMY OPEN    . ESOPHAGOGASTRODUODENOSCOPY ENDOSCOPY  07/2005   Hattie Perch 06/22/2010  . FLEXIBLE SIGMOIDOSCOPY  07/2005   Hattie Perch 06/22/2010  . LOOP RECORDER INSERTION N/A 07/26/2016   Procedure: Loop Recorder Insertion;  Surgeon: Marinus Maw, MD;  Location: [facility] INVASIVE CV LAB;  Service: Cardiovascular;  Laterality: N/A;  . TEE WITHOUT CARDIOVERSION N/A 07/26/2016   Procedure: TRANSESOPHAGEAL ECHOCARDIOGRAM (TEE);  Surgeon: Chrystie Nose, MD;  Location: [facility] ENDOSCOPY;  Service: Cardiovascular;  Laterality: N/A;  . TUBAL LIGATION       OB History   None      Home Medications    Prior to Admission medications   Medication Sig Start Date End Date Taking? Authorizing Provider  aspirin 81 MG chewable tablet Chew 1 tablet (81 mg total) by mouth daily. 07/22/16  Yes Darreld Mclean, MD  atorvastatin (LIPITOR) 40 MG tablet Take 1 tablet (40 mg total) by mouth daily at 6 PM. 07/21/16  Yes Darreld Mclean, MD  clopidogrel (PLAVIX) 75 MG tablet Take 1 tablet (75 mg total) by mouth daily. 07/22/16  Yes Darreld Mclean, MD  DULoxetine (CYMBALTA) 60 MG capsule Take 1 capsule (60 mg total) by mouth 2 (two) times daily. 07/21/16  Yes Darreld Mclean, MD  hydrocortisone cream 1 % Apply to affected area 2 times daily 04/11/17  Yes Graciella Freer A, PA-C  hydrOXYzine (ATARAX/VISTARIL) 25 MG tablet Take 0.5-1 tablets (12.5-25 mg total) by mouth every 8 (eight) hours as needed for itching. 06/12/17  Yes Maczis, Elmer Sow, PA-C  insulin detemir (LEVEMIR) 100 UNIT/ML injection Inject 50 Units into the skin 2 (two)  times daily.   Yes [provider]  insulin lispro (HUMALOG) 100 UNIT/ML cartridge Inject 0-50 Units into the skin daily. Per Sliding scale.   Yes [provider]  Ipratropium-Albuterol (COMBIVENT RESPIMAT) 20-100 MCG/ACT AERS respimat Inhale 1 puff into the lungs 4 (four) times daily. 06/21/17  Yes [provider]  lisinopril (PRINIVIL,ZESTRIL) 20 MG tablet Take 1 tablet (20 mg total) by mouth daily. 06/08/17  Yes Croitoru, Mihai, MD  lisinopril-hydrochlorothiazide (PRINZIDE,ZESTORETIC) 20-25 MG tablet Take 1 tablet by mouth daily. 06/13/17  Yes [provider]  loperamide (IMODIUM) 2 MG capsule Take 2 capsules (4 mg total) by mouth at bedtime as needed for diarrhea or loose stools. 07/01/17  Yes Derwood Kaplan, MD  Melatonin 5 MG TABS Take 1 tablet (5 mg total) by mouth at bedtime as needed (insomnia). 07/26/16  Yes Molt, Bethany, DO  mupirocin cream (BACTROBAN) 2 % Apply 1 application topically 2 (two) times daily. 04/11/17  Yes Graciella Freer A, PA-C  triamcinolone cream (KENALOG) 0.1 % Apply 1 application topically 2 (two) times daily. 06/12/17  Yes Maczis, Elmer Sow, PA-C  HYDROcodone-acetaminophen (NORCO/VICODIN) 5-325 MG tablet Take 1 tablet by mouth every 6 (six) hours as needed. Patient not taking: Reported on 07/03/2017 05/17/17   Aviva Kluver B, PA-C  metFORMIN (GLUCOPHAGE) 500 MG tablet Take 1 tablet (500 mg total) by mouth 2 (two) times daily with a meal. Patient not taking: Reported on 07/03/2017 07/26/16 07/26/17  Molt, Toma Copier, DO  oxyCODONE-acetaminophen (PERCOCET/ROXICET) 5-325 MG tablet Take 1 tablet by mouth every 8 (eight) hours as needed for severe pain. Patient not taking: Reported on 07/03/2017 12/29/16   Jeanie Sewer, PA-C    Family History Family History  Problem Relation Age of Onset  . Heart disease Mother   . Heart attack Mother   . Heart failure Mother   . Hyperlipidemia Mother   . Hypertension Mother   . Cancer Mother   . COPD Mother     . Heart attack Sister   . Heart disease Sister   . Heart failure Sister   . Hyperlipidemia Sister   . Hypertension Sister   . Cancer Sister   . Sleep apnea Sister     Social History Social History   Tobacco Use  . Smoking status: Former Games developer  . Smokeless tobacco: Never Used  . Tobacco comment: 07/19/2016 "just smoke when I get upset"  Substance Use Topics  . Alcohol use: Yes  . Drug use: No     Allergies   Doxycycline; Penicillins; Erythromycin; Nsaids; and Tomato   Review of Systems Review of Systems  Constitutional: Negative for chills and fever.  Respiratory: Negative for shortness of breath.   Cardiovascular: Negative for chest pain.  Gastrointestinal: Positive for diarrhea. Negative for abdominal distention, abdominal pain, nausea and vomiting.  Genitourinary: Negative for dysuria.  Skin:       +"boil"  Neurological: Negative for syncope and weakness.  All other systems reviewed and are negative.    Physical Exam Updated Vital Signs BP (!) 147/92 (BP Location: Right Arm)   Pulse 75   Temp 98.4 F (36.9 C) (Oral)   Resp 16   SpO2 96%   Physical Exam  Constitutional: She appears well-developed and well-nourished. No distress.  HENT:  Head: Normocephalic and atraumatic.  Mouth/Throat: Oropharynx is clear and moist.  Eyes: Pupils are equal, round, and reactive to light. Conjunctivae and EOM are normal.  Neck: Normal range of motion. Neck supple.  Cardiovascular: Normal rate, regular rhythm, S1 normal and S2 normal.  No murmur heard. Pulmonary/Chest: Effort normal and breath sounds normal. She has no wheezes. She has no rales.  Abdominal: Soft. She exhibits no distension. There is no tenderness. There is no guarding.  Genitourinary:  Genitourinary Comments: Examination performed with nurse tech chaperone present.  Patient exhibits a 1.5 cm in diameter carbuncle with punctate area of active drainage of the left pannus.  No spreading erythema.  No  induration.  Musculoskeletal: Normal range of motion. She exhibits no edema or deformity.  Lymphadenopathy:    She has no cervical adenopathy.  Neurological: She is alert.  Cranial nerves grossly intact. Patient moves extremities symmetrically and with good coordination. Normal and symmetric gait.  Skin: Skin is warm and dry.  Psychiatric: She has a normal mood and affect. Her behavior is normal. Judgment and thought content normal.  Nursing note and vitals reviewed.    ED Treatments / Results  Labs (all labs ordered are listed, but only abnormal results are displayed) Labs Reviewed  GASTROINTESTINAL PANEL BY PCR, STOOL (REPLACES STOOL CULTURE)  C DIFFICILE QUICK SCREEN W PCR REFLEX  OCCULT BLOOD X 1 CARD TO LAB, STOOL  POC URINE PREG, ED  POC OCCULT BLOOD, ED    EKG None  Radiology No results found.  Procedures .Marland KitchenIncision and Drainage Date/Time: 07/03/2017 1:48 PM Performed by: Elisha Ponder, PA-C Authorized by: Elisha Ponder, PA-C   Consent:    Consent obtained:  Verbal   Consent given by:  Patient   Risks discussed:  Bleeding, incomplete drainage and pain   Alternatives discussed:  No treatment Location:    Type:  Abscess   Location: Left inguinal region. Pre-procedure details:    Skin preparation:  Betadine Anesthesia (see MAR for exact dosages):    Anesthesia method:  Local infiltration   Local anesthetic:  Lidocaine 1% w/o epi Procedure type:    Complexity:  Simple Procedure details:    Incision types:  Single straight   Incision depth:  Dermal   Scalpel blade:  11   Wound management:  Probed and deloculated   Drainage:  Purulent   Drainage amount:  Moderate   Wound treatment:  Wound left open Post-procedure details:    Patient tolerance of procedure:  Tolerated well, no immediate complications Comments:     Patient is on aspirin and Plavix.  Site was compressed until bleeding ceased.   (including critical care time)  Medications Ordered  in ED Medications  lidocaine (PF) (XYLOCAINE) 1 % injection (  Given 07/03/17 1241)     Initial Impression / Assessment and Plan / ED Course  I have reviewed the triage vital signs and the nursing notes.  Pertinent labs & imaging results that were available during my care of the patient were reviewed by me and considered in my medical decision  making (see chart for details).     Patient nontoxic-appearing, afebrile, and with benign abdominal exam.  Patient hemodynamically stable.  Patient was evaluated on 06-30-2017 for diarrhea, and had normal labs at this time without evidence of dehydration.  I discussed with the patient obtaining stool sample, given the length of time she has experienced diarrhea, but patient unable to provide sample in emergency department.  Patient was Hemoccult negative, and rectal examination demonstrated no  melena.  I instructed patient to bring stool sample to her primary care provider when she is able to produce a sample for testing.  Regarding abscess of left pannus in the groin, there is no evidence of surrounding cellulitis, therefore feel the I&D will be sufficient with good wound care and close follow-up, given that patient is already experiencing diarrhea.  Patient given written instructions on warm compresses, as well as return precautions for any spreading erythema or redevelopment of abscess.  Patient to follow with primary care provider.  Patient is in understanding and agrees with the plan of care.  Final Clinical Impressions(s) / ED Diagnoses   Final diagnoses:  Carbuncle of groin  Elevated blood pressure reading in office with diagnosis of hypertension    ED Discharge Orders    None       Delia Chimes 07/03/17 1910    Tilden Fossa, MD 07/04/17 [phone]

## 2017-07-03 NOTE — Discharge Instructions (Signed)
Please see the information and instructions below regarding your visit.  Your diagnoses today include:  1. Carbuncle of groin   2. Elevated blood pressure reading in office with diagnosis of hypertension     Abscesses form when an infection in your skin starts to collect bacteria and white blood cells, walling it off from the rest of your body to protect you from a bigger infection. Risk factors for this type of infection include:  ?Break in the skin ?Diabetes ?Swollen areas  Sometimes the infection starts to spread to surrounding tissue, causing redness and swelling. We call this cellulitis.   Tests performed today include: See side panel of your discharge paperwork for testing performed today. Vital signs are listed at the bottom of these instructions.   Medications prescribed:    Take any prescribed medications only as prescribed, and any over the counter medications only as directed on the packaging.  Home care instructions:  Please follow any educational materials contained in this packet.   Some things that may promote healing of your wound and infection include:  Raise your arm or leg to reduce swelling - Raise the arm or leg up above the level of your heart 3 or 4 times a day, for 30 minutes each time. Keep the infected area clean and dry. You can take a shower or bath, but be sure to pat the area dry with a towel afterward. Do not put any antibiotic ointments or creams on the area. Reapply a dry gauze dressing any time the bandage has become soaked with drainage, or after cleansing the wound.  Apply warm compresses to the wound 3-4 times daily to encourage drainage.  Please apply warm compresses or sitz bath at least 4 times daily.   Return instructions:  Please return to the Emergency Department if you experience worsening symptoms. You should return for reevaluation of your infection if you notice spreading redness, increased swelling, an abscess develops, or you  develop signs and symptoms of a systemic illness such as fever and chills.  Please return if you have any other emergent concerns.  Additional Information:   Your vital signs today were: BP (!) 147/92 (BP Location: Right Arm)    Pulse 75    Temp 98.4 F (36.9 C) (Oral)    Resp 16    SpO2 96%  If your blood pressure (BP) was elevated on multiple readings during this visit above 130 for the top number or above 80 for the bottom number, please have this repeated by your primary care provider within one month. --------------  Thank you for allowing [place] to participate in your care today.

## 2017-07-03 NOTE — Progress Notes (Signed)
Carelink Summary Report / Loop Recorder 

## 2017-07-03 NOTE — ED Triage Notes (Signed)
Pt states she has a boil in her groin that came up about 3 days ago, reports her husband "popped" it 2 days ago but still has swelling and pain. Also reports diarrhea with no abd pain.

## 2017-07-13 ENCOUNTER — Ambulatory Visit: Payer: Self-pay | Admitting: Neurology

## 2017-07-20 ENCOUNTER — Ambulatory Visit (INDEPENDENT_AMBULATORY_CARE_PROVIDER_SITE_OTHER): Payer: Medicare Other | Admitting: Pharmacist Clinician (PhC)/ Clinical Pharmacy Specialist

## 2017-07-20 DIAGNOSIS — I10 Essential (primary) hypertension: Secondary | ICD-10-CM

## 2017-07-20 NOTE — Progress Notes (Signed)
07/20/2017 DARBEY BORBON Jan 08, 1961 [id number]   HPI:  Tonya Sanders is a 57 y.o. female patient of Dr Royann Shivers, with a PMH below who presents today for hypertension clinic evaluation.  In addition to hypertension, her medical history is significant for insulin dependent DM2 (with neuropathy), COPD, hyperlipidemia and cryptogenic stroke in June 2018.  She states her blood pressure has been elevated ever since she was diagnosed with DM, about 8 years ago.  She has difficulty with some memory, including physician names, what her medications are and which pharmacy she uses.  She refers to her insulins as the "blue" and the "green" ones.  Her husband apparently puts her mediations into several 7 day pill minders, then will call her throughout the day to remind her to take meds.   He works in [employer], 6 days per week and long days.    In past chart notes are instances of increasing her lisinopril dose, only to have the patient come back for follow up on the lower dose. I think it has been more an issue of confusion than non-compliance.  She has no recollection of her PCP switching her lisinopril to lisinopril hctz on Monday of this week, despite the chart indicating patient was aware to go to pharmacy for new medication.    Blood Pressure Goal:  130/80  Current Medications:  Lisinopril 20 mg  Family Hx:  Did not know father  Mother and 1/2 sister with hypertension and diabetes  3 children, ages 66,35,33 - no known health problems  Diet:  Patient states she does not add salt to foods, eats a mix of fresh and frozen vegetables with occasional canned products.  States very little breads or pastas.  Exercise:  Previous notes indicate patient is quite inactive and that she has difficulty with walking or climbing stairs, however she tells me today that she goes to [employer] daily and walks on the treadmill for 60-90 minutes (at a pace of 2-3 mph)  Home BP readings:  No home BP  cuff  Intolerances:   No cardiac intolerances  CrCl cannot be calculated (Unknown ideal weight.).  Wt Readings from Last 3 Encounters:  06/08/17 211 lb 3.2 oz (95.8 kg)  12/29/16 205 lb (93 kg)  07/19/16 226 lb 6.4 oz (102.7 kg)   BP Readings from Last 3 Encounters:  07/20/17 (!) 164/80  07/03/17 (!) 160/90  07/01/17 (!) 143/75   Pulse Readings from Last 3 Encounters:  07/20/17 84  07/03/17 85  07/01/17 81    Current Outpatient Medications  Medication Sig Dispense Refill  . aspirin 81 MG chewable tablet Chew 1 tablet (81 mg total) by mouth daily. 30 tablet 1  . atorvastatin (LIPITOR) 40 MG tablet Take 1 tablet (40 mg total) by mouth daily at 6 PM. 30 tablet 1  . clopidogrel (PLAVIX) 75 MG tablet Take 1 tablet (75 mg total) by mouth daily. 30 tablet 1  . DULoxetine (CYMBALTA) 60 MG capsule Take 1 capsule (60 mg total) by mouth 2 (two) times daily. 30 capsule 1  . HYDROcodone-acetaminophen (NORCO/VICODIN) 5-325 MG tablet Take 1 tablet by mouth every 6 (six) hours as needed. (Patient not taking: Reported on 07/03/2017) 6 tablet 0  . hydrocortisone cream 1 % Apply to affected area 2 times daily 15 g 0  . hydrOXYzine (ATARAX/VISTARIL) 25 MG tablet Take 0.5-1 tablets (12.5-25 mg total) by mouth every 8 (eight) hours as needed for itching. 20 tablet 0  . insulin detemir (  LEVEMIR) 100 UNIT/ML injection Inject 50 Units into the skin 2 (two) times daily.    . insulin lispro (HUMALOG) 100 UNIT/ML cartridge Inject 0-50 Units into the skin daily. Per Sliding scale.    . Ipratropium-Albuterol (COMBIVENT RESPIMAT) 20-100 MCG/ACT AERS respimat Inhale 1 puff into the lungs 4 (four) times daily.    Marland Kitchen lisinopril (PRINIVIL,ZESTRIL) 20 MG tablet Take 1 tablet (20 mg total) by mouth daily. 90 tablet 3  . lisinopril-hydrochlorothiazide (PRINZIDE,ZESTORETIC) 20-25 MG tablet Take 1 tablet by mouth daily.    Marland Kitchen loperamide (IMODIUM) 2 MG capsule Take 2 capsules (4 mg total) by mouth at bedtime as needed for  diarrhea or loose stools. 12 capsule 0  . Melatonin 5 MG TABS Take 1 tablet (5 mg total) by mouth at bedtime as needed (insomnia). 30 tablet 1  . metFORMIN (GLUCOPHAGE) 500 MG tablet Take 1 tablet (500 mg total) by mouth 2 (two) times daily with a meal. (Patient not taking: Reported on 07/03/2017) 60 tablet 1  . mupirocin cream (BACTROBAN) 2 % Apply 1 application topically 2 (two) times daily. 15 g 0  . oxyCODONE-acetaminophen (PERCOCET/ROXICET) 5-325 MG tablet Take 1 tablet by mouth every 8 (eight) hours as needed for severe pain. (Patient not taking: Reported on 07/03/2017) 10 tablet 0  . triamcinolone cream (KENALOG) 0.1 % Apply 1 application topically 2 (two) times daily. 30 g 0   No current facility-administered medications for this visit.     Allergies  Allergen Reactions  . Doxycycline Nausea And Vomiting  . Penicillins Nausea And Vomiting    Has patient had a PCN reaction causing immediate rash, facial/tongue/throat swelling, SOB or lightheadedness with hypotension: No Has patient had a PCN reaction causing severe rash involving mucus membranes or skin necrosis: No Has patient had a PCN reaction that required hospitalization: No Has patient had a PCN reaction occurring within the last 10 years: No If all of the above answers are "NO", then may proceed with Cephalosporin use.   . Erythromycin Rash    Throat swelling  . Nsaids Nausea And Vomiting    Fatty liver  . Tomato Other (See Comments)    unspecified    Past Medical History:  Diagnosis Date  . Anxiety   . Arthritis    "all over" (07/19/2016)  . Asthma   . Bipolar disorder (HCC)   . Chronic bronchitis (HCC)   . Chronic lower back pain   . Depression   . GERD (gastroesophageal reflux disease)   . H. pylori infection   . Hyperlipidemia   . Hypertension   . Migraine    "a few/month" (07/19/2016)  . Myocardial infarction (HCC) 2016  . Neuropathy   . Neuropathy    Hattie Perch 07/19/2016  . PUD (peptic ulcer disease)     hx/notes 06/22/2010  . Seizures (HCC)   . Stroke [facility])    "I've had several"; denies residual (07/19/2016)  . Type II diabetes mellitus (HCC)     Blood pressure (!) 164/80, pulse 84, height 5' 4.5" (1.638 m).  Hypertension Patient with elevated blood pressure, most likely in need of 3+ medications to control.  Unfortunately because of some educational barriers (post stroke?) this may be difficult to accomplish.  She has not yet picked up the lisinopril/hctz combination that her PCP ordered earlier in the week.  I don't know if there is a financial constraint here as well.  I confirmed that she is headed to the pharmacy to pick up the lisinopril hctz.  I  also asked that her husband write a list of which medications she is currently taking and at which times of the day.  She was hesitant to say that he would do that, stating she didn't want to disturb him "as he is so busy".  Explained that this would be vitally important in our keeping her healthy.  Also invited her to bring him to her next appointment.  We will see her back in 4 weeks, hopefully she will be on the lisinopril/hctz combination by then.    Phillips Hay PharmD CPP [facility] [address] [place], [place] [address] [phone]

## 2017-07-20 NOTE — Patient Instructions (Addendum)
Return for a a follow up appointment in   Your blood pressure today is 164/80  Take your BP meds as follows:  Stop taking the lisinopril and start with the lisinopril-hydrochlorothiazide 20/25 mg tablet.  Take one each morning.     Bring in a list of all your medications to your next visit.  Mark on the list below what medications are taken at which time of the day:  Morning (7am) Noon (12 pm) 4 pm  Evening (8pm)

## 2017-07-20 NOTE — Assessment & Plan Note (Signed)
Patient with elevated blood pressure, most likely in need of 3+ medications to control.  Unfortunately because of some educational barriers (post stroke?) this may be difficult to accomplish.  She has not yet picked up the lisinopril/hctz combination that her PCP ordered earlier in the week.  I don't know if there is a financial constraint here as well.  I confirmed that she is headed to the pharmacy to pick up the lisinopril hctz.  I also asked that her husband write a list of which medications she is currently taking and at which times of the day.  She was hesitant to say that he would do that, stating she didn't want to disturb him "as he is so busy".  Explained that this would be vitally important in our keeping her healthy.  Also invited her to bring him to her next appointment.  We will see her back in 4 weeks, hopefully she will be on the lisinopril/hctz combination by then.

## 2017-07-20 NOTE — Progress Notes (Signed)
We will keep on trying [facility]

## 2017-07-24 LAB — CUP PACEART REMOTE DEVICE CHECK
Date Time Interrogation Session: 20190526083530
MDC IDC PG IMPLANT DT: 20180620

## 2017-08-03 ENCOUNTER — Ambulatory Visit (INDEPENDENT_AMBULATORY_CARE_PROVIDER_SITE_OTHER): Payer: Medicare Other | Admitting: *Deleted

## 2017-08-03 ENCOUNTER — Emergency Department (HOSPITAL_COMMUNITY): Payer: Medicare Other

## 2017-08-03 ENCOUNTER — Emergency Department (HOSPITAL_COMMUNITY)
Admission: EM | Admit: 2017-08-03 | Discharge: 2017-08-03 | Disposition: A | Discharge status: Home / Self Care | Payer: Medicare Other | Attending: Emergency Medicine | Admitting: Emergency Medicine

## 2017-08-03 ENCOUNTER — Encounter (HOSPITAL_COMMUNITY): Payer: Self-pay | Admitting: Emergency Medicine

## 2017-08-03 DIAGNOSIS — E1140 Type 2 diabetes mellitus with diabetic neuropathy, unspecified: Secondary | ICD-10-CM | POA: Insufficient documentation

## 2017-08-03 DIAGNOSIS — Z79899 Other long term (current) drug therapy: Secondary | ICD-10-CM | POA: Insufficient documentation

## 2017-08-03 DIAGNOSIS — Z794 Long term (current) use of insulin: Secondary | ICD-10-CM | POA: Insufficient documentation

## 2017-08-03 DIAGNOSIS — Z8673 Personal history of transient ischemic attack (TIA), and cerebral infarction without residual deficits: Secondary | ICD-10-CM | POA: Diagnosis not present

## 2017-08-03 DIAGNOSIS — I639 Cerebral infarction, unspecified: Secondary | ICD-10-CM | POA: Diagnosis not present

## 2017-08-03 DIAGNOSIS — J45909 Unspecified asthma, uncomplicated: Secondary | ICD-10-CM | POA: Diagnosis not present

## 2017-08-03 DIAGNOSIS — W010XXA Fall on same level from slipping, tripping and stumbling without subsequent striking against object, initial encounter: Secondary | ICD-10-CM | POA: Diagnosis not present

## 2017-08-03 DIAGNOSIS — I252 Old myocardial infarction: Secondary | ICD-10-CM | POA: Diagnosis not present

## 2017-08-03 DIAGNOSIS — Y999 Unspecified external cause status: Secondary | ICD-10-CM | POA: Diagnosis not present

## 2017-08-03 DIAGNOSIS — Y939 Activity, unspecified: Secondary | ICD-10-CM | POA: Insufficient documentation

## 2017-08-03 DIAGNOSIS — Z7982 Long term (current) use of aspirin: Secondary | ICD-10-CM | POA: Diagnosis not present

## 2017-08-03 DIAGNOSIS — Z7902 Long term (current) use of antithrombotics/antiplatelets: Secondary | ICD-10-CM | POA: Insufficient documentation

## 2017-08-03 DIAGNOSIS — S52125A Nondisplaced fracture of head of left radius, initial encounter for closed fracture: Secondary | ICD-10-CM | POA: Diagnosis not present

## 2017-08-03 DIAGNOSIS — I10 Essential (primary) hypertension: Secondary | ICD-10-CM | POA: Diagnosis not present

## 2017-08-03 DIAGNOSIS — Y929 Unspecified place or not applicable: Secondary | ICD-10-CM | POA: Insufficient documentation

## 2017-08-03 DIAGNOSIS — Z87891 Personal history of nicotine dependence: Secondary | ICD-10-CM | POA: Insufficient documentation

## 2017-08-03 DIAGNOSIS — S4992XA Unspecified injury of left shoulder and upper arm, initial encounter: Secondary | ICD-10-CM | POA: Diagnosis present

## 2017-08-03 DIAGNOSIS — W19XXXA Unspecified fall, initial encounter: Secondary | ICD-10-CM

## 2017-08-03 MED ORDER — METHOCARBAMOL 500 MG PO TABS: 500.0000 mg | ORAL_TABLET | Freq: Every evening | ORAL | 0 refills | Status: DC | PRN

## 2017-08-03 MED ORDER — KETOROLAC TROMETHAMINE 15 MG/ML IJ SOLN
15.0000 mg | Freq: Once | INTRAMUSCULAR | Status: AC
  Administered 2017-08-03: 15 mg via INTRAMUSCULAR
  Filled 2017-08-03: qty 1

## 2017-08-03 MED ORDER — METHOCARBAMOL 500 MG PO TABS
500.0000 mg | ORAL_TABLET | Freq: Once | ORAL | Status: AC
  Administered 2017-08-03: 500 mg via ORAL
  Filled 2017-08-03: qty 1

## 2017-08-03 MED ORDER — HYDROCODONE-ACETAMINOPHEN 5-325 MG PO TABS: 1.0000 | ORAL_TABLET | Freq: Four times a day (QID) | ORAL | 0 refills | Status: DC | PRN

## 2017-08-03 NOTE — ED Provider Notes (Signed)
[facility] EMERGENCY DEPARTMENT Provider Note   CSN: [id number] Arrival date & time: 08/03/17  1133     History   Chief Complaint Chief Complaint  Patient presents with  . Shoulder Pain    HPI Tonya Sanders is a 57 y.o. female presenting for evaluation of left arm pain.  Patient states that she tripped this morning, landing on her left shoulder.  She denies hitting her head or loss of consciousness.  She is not on blood thinners.  She reports acute onset of left arm pain.  She has not taken anything for this, states she has fatty liver so she was told not to take Tylenol or lots of ibuprofen.  She denies numbness or tingling.  She states pain is worse with movement of her shoulder or her elbow.  She can move her hand without difficulty.  She denies injury elsewhere.  She denies pain in her head, neck, back, pelvis, or right side.  HPI  Past Medical History:  Diagnosis Date  . Anxiety   . Arthritis    "all over" (07/19/2016)  . Asthma   . Bipolar disorder (HCC)   . Chronic bronchitis (HCC)   . Chronic lower back pain   . Depression   . GERD (gastroesophageal reflux disease)   . H. pylori infection   . Hyperlipidemia   . Hypertension   . Migraine    "a few/month" (07/19/2016)  . Myocardial infarction (HCC) 2016  . Neuropathy   . Neuropathy    Hattie Perch 07/19/2016  . PUD (peptic ulcer disease)    hx/notes 06/22/2010  . Seizures (HCC)   . Stroke [facility])    "I've had several"; denies residual (07/19/2016)  . Type II diabetes mellitus [facility])     Patient Active Problem List   Diagnosis Date Noted  . Mixed hyperlipidemia due to type 2 diabetes mellitus (HCC) 06/10/2017  . Encounter for loop recorder check 06/10/2017  . Hypertension 07/25/2016  . Hyperlipidemia 07/25/2016  . Diabetes mellitus type 2 in obese (HCC) 07/25/2016  . Seizures (HCC) 07/25/2016  . Cerebellar stroke (HCC)   . 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 APPENDECTOMY    . CARDIAC CATHETERIZATION  2016  . CHOLECYSTECTOMY OPEN    . ESOPHAGOGASTRODUODENOSCOPY ENDOSCOPY  07/2005   Hattie Perch 06/22/2010  . FLEXIBLE SIGMOIDOSCOPY  07/2005   Hattie Perch 06/22/2010  . LOOP RECORDER INSERTION N/A 07/26/2016   Procedure: Loop Recorder Insertion;  Surgeon: Marinus Maw, MD;  Location: [facility] INVASIVE CV LAB;  Service: Cardiovascular;  Laterality: N/A;  . TEE WITHOUT CARDIOVERSION N/A 07/26/2016   Procedure: TRANSESOPHAGEAL ECHOCARDIOGRAM (TEE);  Surgeon: Chrystie Nose, MD;  Location: [facility] ENDOSCOPY;  Service: Cardiovascular;  Laterality: N/A;  . TUBAL LIGATION       OB History   None      Home Medications    Prior to Admission medications   Medication Sig Start Date End Date Taking? Authorizing Provider  aspirin 81 MG chewable tablet Chew 1 tablet (81 mg total) by mouth daily. 07/22/16   Darreld Mclean, MD  atorvastatin (LIPITOR) 40 MG tablet Take 1 tablet (40 mg total) by mouth daily at 6 PM. 07/21/16   Darreld Mclean, MD  clopidogrel (PLAVIX) 75 MG tablet Take 1 tablet (75 mg total) by mouth daily. 07/22/16   Darreld Mclean, MD  DULoxetine (CYMBALTA) 60 MG capsule Take 1 capsule (60 mg total) by mouth 2 (two) times  daily. 07/21/16   Darreld Mclean, MD  HYDROcodone-acetaminophen (NORCO/VICODIN) 5-325 MG tablet Take 1 tablet by mouth every 6 (six) hours as needed for severe pain. 08/03/17   Orry Sigl, PA-C  hydrocortisone cream 1 % Apply to affected area 2 times daily 04/11/17   Graciella Freer A, PA-C  hydrOXYzine (ATARAX/VISTARIL) 25 MG tablet Take 0.5-1 tablets (12.5-25 mg total) by mouth every 8 (eight) hours as needed for itching. 06/12/17   Maczis, Elmer Sow, PA-C  insulin detemir (LEVEMIR) 100 UNIT/ML injection Inject 50 Units into the skin 2 (two) times daily.    [provider]  insulin lispro (HUMALOG) 100 UNIT/ML cartridge Inject 0-50 Units into the skin daily. Per Sliding scale.    [provider]   Ipratropium-Albuterol (COMBIVENT RESPIMAT) 20-100 MCG/ACT AERS respimat Inhale 1 puff into the lungs 4 (four) times daily. 06/21/17   [provider]  lisinopril (PRINIVIL,ZESTRIL) 20 MG tablet Take 1 tablet (20 mg total) by mouth daily. 06/08/17   Croitoru, Mihai, MD  lisinopril-hydrochlorothiazide (PRINZIDE,ZESTORETIC) 20-25 MG tablet Take 1 tablet by mouth daily. 06/13/17   [provider]  loperamide (IMODIUM) 2 MG capsule Take 2 capsules (4 mg total) by mouth at bedtime as needed for diarrhea or loose stools. 07/01/17   Derwood Kaplan, MD  Melatonin 5 MG TABS Take 1 tablet (5 mg total) by mouth at bedtime as needed (insomnia). 07/26/16   Molt, Bethany, DO  metFORMIN (GLUCOPHAGE) 500 MG tablet Take 1 tablet (500 mg total) by mouth 2 (two) times daily with a meal. Patient not taking: Reported on 07/03/2017 07/26/16 07/26/17  Molt, Bethany, DO  methocarbamol (ROBAXIN) 500 MG tablet Take 1 tablet (500 mg total) by mouth at bedtime as needed for muscle spasms. 08/03/17   Odin Mariani, PA-C  mupirocin cream (BACTROBAN) 2 % Apply 1 application topically 2 (two) times daily. 04/11/17   Maxwell Caul, PA-C  oxyCODONE-acetaminophen (PERCOCET/ROXICET) 5-325 MG tablet Take 1 tablet by mouth every 8 (eight) hours as needed for severe pain. Patient not taking: Reported on 07/03/2017 12/29/16   Michela Pitcher A, PA-C  triamcinolone cream (KENALOG) 0.1 % Apply 1 application topically 2 (two) times daily. 06/12/17   Maczis, Elmer Sow, PA-C    Family History Family History  Problem Relation Age of Onset  . Heart disease Mother   . Heart attack Mother   . Heart failure Mother   . Hyperlipidemia Mother   . Hypertension Mother   . Cancer Mother   . COPD Mother   . Heart attack Sister   . Heart disease Sister   . Heart failure Sister   . Hyperlipidemia Sister   . Hypertension Sister   . Cancer Sister   . Sleep apnea Sister     Social History Social History   Tobacco Use  . Smoking  status: Former Games developer  . Smokeless tobacco: Never Used  . Tobacco comment: 07/19/2016 "just smoke when I get upset"  Substance Use Topics  . Alcohol use: Yes  . Drug use: No     Allergies   Doxycycline; Penicillins; Erythromycin; Nsaids; and Tomato   Review of Systems Review of Systems  Musculoskeletal: Positive for arthralgias and myalgias.  Hematological: Does not bruise/bleed easily.     Physical Exam Updated Vital Signs BP (!) 118/94 (BP Location: Right Arm)   Pulse 84   Temp 99.2 F (37.3 C) (Oral)   Resp 18   SpO2 100%   Physical Exam  Constitutional: She is oriented to person, place,  and time. She appears well-developed and well-nourished. No distress.  HENT:  Head: Normocephalic and atraumatic.  Eyes: EOM are normal.  Neck: Normal range of motion.  No tenderness palpation of midline C-spine.  Full active range of motion of the head/neck without pain  Pulmonary/Chest: Effort normal.  Abdominal: She exhibits no distension.  Musculoskeletal: Normal range of motion.  Tenderness palpation of proximal left forearm, left elbow, left biceps.  No tenderness palpation of left shoulder.  No obvious dislocation.  Radial pulses intact bilaterally.  Grip strength intact bilaterally.  Soft compartments.  Patient unable to extend or flex elbow.  Patient reports lots of pain with passive range of motion of the shoulder. No tenderness palpation of back or midline spine.  Patient is ambulatory.  Neurological: She is alert and oriented to person, place, and time.  Skin: Skin is warm. No rash noted.  Psychiatric: She has a normal mood and affect.  Nursing note and vitals reviewed.    ED Treatments / Results  Labs (all labs ordered are listed, but only abnormal results are displayed) Labs Reviewed - No data to display  EKG None  Radiology Dg Elbow Complete Left  Result Date: 08/03/2017 CLINICAL DATA:  Fall with left elbow pain.  Initial encounter. EXAM: LEFT ELBOW -  COMPLETE 3+ VIEW COMPARISON:  None. FINDINGS: Branching acute fracture in the radial head with depression measuring up to 2-3 mm on the lateral view. Joint effusion. No dislocation. IMPRESSION: 1. Acute branching radial head fracture with depression along the articular surface. 2. Large joint effusion Electronically Signed   By: Marnee Spring M.D.   On: 08/03/2017 14:51   Dg Shoulder Left  Result Date: 08/03/2017 CLINICAL DATA:  Status post fall.  Left-sided pain. EXAM: LEFT SHOULDER - 2+ VIEW COMPARISON:  None. FINDINGS: There is no evidence of fracture or dislocation. There is no evidence of arthropathy or other focal bone abnormality. Soft tissues are unremarkable. IMPRESSION: Negative. Electronically Signed   By: Elige Ko   On: 08/03/2017 12:15    Procedures Procedures (including critical care time)  Medications Ordered in ED Medications  methocarbamol (ROBAXIN) tablet 500 mg (500 mg Oral Given 08/03/17 1356)  ketorolac (TORADOL) 15 MG/ML injection 15 mg (15 mg Intramuscular Given 08/03/17 1356)     Initial Impression / Assessment and Plan / ED Course  I have reviewed the triage vital signs and the nursing notes.  Pertinent labs & imaging results that were available during my care of the patient were reviewed by me and considered in my medical decision making (see chart for details).     Pt presenting for evaluation of left arm pain.  Physical exam reassuring, she is neurovascularly intact.  However, patient unable to move her left arm actively, and has increased pain with passive range of motion.  Shoulder x-ray viewed interpreted by me, no fracture dislocation.  However, patient is holding her arm in a flexed position of the elbow, concern for possible elbow injury.  Will treat with Toradol, Robaxin, and obtain elbow x-ray.  Pain is improved in medications.  X-ray shows radial head fracture with 2 to 3 mm of depression.  Discussed with Dr. Orma Render, who recommends posterior elbow  splint and follow-up with Ortho.  Splint applied, patient neurovascularly intact following splint placement with good cap refill and sensation.  Patient instructed to follow-up with orthopedics.  Will discharge with short course of Norco, naproxen, and Robaxin.  Splint for comfort.  At this time, patient appears safe for  discharge.  Return percussions given.  Patient states she understands and agrees to plan.   Final Clinical Impressions(s) / ED Diagnoses   Final diagnoses:  Nondisplaced fracture of head of left radius, initial encounter for closed fracture  Fall, initial encounter    ED Discharge Orders        Ordered    HYDROcodone-acetaminophen (NORCO/VICODIN) 5-325 MG tablet  Every 6 hours PRN     08/03/17 1620    methocarbamol (ROBAXIN) 500 MG tablet  At bedtime PRN     08/03/17 1620       Alveria Apley, PA-C 08/03/17 1637    Charlynne Pander, MD 08/06/17 [phone]

## 2017-08-03 NOTE — Discharge Instructions (Signed)
Take naproxen 2 times a day with meals.  Do not take other anti-inflammatories at the same time open (Advil, Motrin, ibuprofen, Aleve). You may supplement with norco as needed for severe or breakthrough pain.  Use robaxin as needed for muscle spasms. Do not take at the same time as the norco. Keep your arm elevated when able.  Use the sling for comfort. Follow up with the orthopedic (bone) doctor. You will need to call to schedule an appointment.  Return to the ER if you develop numbness, inability to move your fingers, or any new or concerning symptoms.

## 2017-08-03 NOTE — ED Triage Notes (Signed)
Pt to ER reports mechanical fall to left side this morning falling on left shoulder. Reports significant pain to left shoulder/arm since. Difficulty with ROM.

## 2017-08-03 NOTE — ED Notes (Signed)
Ortho paged. 

## 2017-08-03 NOTE — Progress Notes (Signed)
Carelink Summary Report / Loop Recorder 

## 2017-08-03 NOTE — ED Notes (Signed)
Pt verbalized understanding discharge instructions and denies any further needs or questions at this time. VS stable, ambulatory and steady gait.   

## 2017-08-03 NOTE — Progress Notes (Signed)
Orthopedic Tech Progress Note Patient Details:  Tonya Sanders January 19, 1961 [id number]  Ortho Devices Type of Ortho Device: Ace wrap, Arm sling, Long arm splint Ortho Device/Splint Location: lue Ortho Device/Splint Interventions: Application   Post Interventions Patient Tolerated: Well Instructions Provided: Care of device   Nikki Dom 08/03/2017, 4:29 PM

## 2017-08-03 NOTE — ED Notes (Signed)
Patient transported to X-ray 

## 2017-08-06 ENCOUNTER — Emergency Department (HOSPITAL_COMMUNITY)
Admission: EM | Admit: 2017-08-06 | Discharge: 2017-08-06 | Disposition: A | Discharge status: Home / Self Care | Payer: Medicare Other | Attending: Emergency Medicine | Admitting: Emergency Medicine

## 2017-08-06 ENCOUNTER — Encounter (HOSPITAL_COMMUNITY): Payer: Self-pay | Admitting: Emergency Medicine

## 2017-08-06 ENCOUNTER — Other Ambulatory Visit: Payer: Self-pay

## 2017-08-06 DIAGNOSIS — Z79899 Other long term (current) drug therapy: Secondary | ICD-10-CM | POA: Insufficient documentation

## 2017-08-06 DIAGNOSIS — Z87891 Personal history of nicotine dependence: Secondary | ICD-10-CM | POA: Diagnosis not present

## 2017-08-06 DIAGNOSIS — I252 Old myocardial infarction: Secondary | ICD-10-CM | POA: Insufficient documentation

## 2017-08-06 DIAGNOSIS — M79602 Pain in left arm: Secondary | ICD-10-CM | POA: Diagnosis present

## 2017-08-06 DIAGNOSIS — Z794 Long term (current) use of insulin: Secondary | ICD-10-CM | POA: Diagnosis not present

## 2017-08-06 DIAGNOSIS — S52125D Nondisplaced fracture of head of left radius, subsequent encounter for closed fracture with routine healing: Secondary | ICD-10-CM | POA: Diagnosis not present

## 2017-08-06 DIAGNOSIS — J45909 Unspecified asthma, uncomplicated: Secondary | ICD-10-CM | POA: Diagnosis not present

## 2017-08-06 DIAGNOSIS — X58XXXD Exposure to other specified factors, subsequent encounter: Secondary | ICD-10-CM | POA: Diagnosis not present

## 2017-08-06 DIAGNOSIS — Z7982 Long term (current) use of aspirin: Secondary | ICD-10-CM | POA: Diagnosis not present

## 2017-08-06 DIAGNOSIS — E119 Type 2 diabetes mellitus without complications: Secondary | ICD-10-CM | POA: Insufficient documentation

## 2017-08-06 DIAGNOSIS — I10 Essential (primary) hypertension: Secondary | ICD-10-CM | POA: Insufficient documentation

## 2017-08-06 LAB — CBC
HCT: 37.4 % (ref 36.0–46.0)
Hemoglobin: 12.6 g/dL (ref 12.0–15.0)
MCH: 31.4 pg (ref 26.0–34.0)
MCHC: 33.7 g/dL (ref 30.0–36.0)
MCV: 93.3 fL (ref 78.0–100.0)
Platelets: 323 10*3/uL (ref 150–400)
RBC: 4.01 MIL/uL (ref 3.87–5.11)
RDW: 12 % (ref 11.5–15.5)
WBC: 10.4 10*3/uL (ref 4.0–10.5)

## 2017-08-06 LAB — BASIC METABOLIC PANEL
Anion gap: 9 (ref 5–15)
BUN: 32 mg/dL — AB (ref 6–20)
CHLORIDE: 108 mmol/L (ref 98–111)
CO2: 24 mmol/L (ref 22–32)
Calcium: 9.6 mg/dL (ref 8.9–10.3)
Creatinine, Ser: 0.98 mg/dL (ref 0.44–1.00)
Glucose, Bld: 65 mg/dL — ABNORMAL LOW (ref 70–99)
POTASSIUM: 3.5 mmol/L (ref 3.5–5.1)
SODIUM: 141 mmol/L (ref 135–145)

## 2017-08-06 LAB — CBG MONITORING, ED
Glucose-Capillary: 52 mg/dL — ABNORMAL LOW (ref 70–99)
Glucose-Capillary: 66 mg/dL — ABNORMAL LOW (ref 70–99)
Glucose-Capillary: 149 mg/dL — ABNORMAL HIGH (ref 70–99)

## 2017-08-06 MED ORDER — OXYCODONE-ACETAMINOPHEN 5-325 MG PO TABS
1.0000 | ORAL_TABLET | ORAL | Status: AC | PRN
  Administered 2017-08-06 (×2): 1 via ORAL
  Filled 2017-08-06 (×2): qty 1

## 2017-08-06 MED ORDER — OXYCODONE-ACETAMINOPHEN 5-325 MG PO TABS: 1.0000 | ORAL_TABLET | Freq: Once | ORAL | Status: DC

## 2017-08-06 MED ORDER — HYDROCODONE-ACETAMINOPHEN 5-325 MG PO TABS: 1.0000 | ORAL_TABLET | ORAL | 0 refills | Status: DC | PRN

## 2017-08-06 NOTE — ED Notes (Signed)
Per pt - presents to ED for increased pain to LFA - previously fx'd - reports broke it "pulling the mattress up on the bed" - states occurred x4 days ago - was given pain meds at that time; is reportedly out of pain meds - has not followed up with ortho yet; OCL splint currently intact to LFA

## 2017-08-06 NOTE — ED Provider Notes (Signed)
Patient placed in Quick Look pathway, seen and evaluated   Chief Complaint: Left arm pain  HPI:   57 year old female presents today with complaints of left arm pain.  Patient notes that she suffered a fracture to her arm 3 days ago.  She was seen in the emergency room placed in a splint and discharged home.  Patient notes she has had sensation and range of motion of the fingers since then.  She notes that she twisted her wrist this morning causing excruciating pain.  Since that time she notes she has lost sensation in her arm.  Patient also notes that her blood sugar is elevated.  ROS: Arm pain (one)  Physical Exam:   Gen: No distress  Neuro: Awake and Alert  Skin: Warm    Focused Exam: Left hand warm and well-perfused cap refill intact less than 2 seconds, diffuse loss of sensation per patient-patient is able to move her fingers (would not do this on command but noted with distraction) joint compartments are soft  No signs of compartment syndrome   Initiation of care has begun. The patient has been counseled on the process, plan, and necessity for staying for the completion/evaluation, and the remainder of the medical screening examination    Rosalio Loud 08/06/17 1228    Bethann Berkshire, MD 08/09/17 [phone]

## 2017-08-06 NOTE — Discharge Instructions (Addendum)
1.  Follow-up with the orthopedic physician you are instructed to see as soon as possible.

## 2017-08-06 NOTE — ED Triage Notes (Addendum)
Patient to ED c/o sudden worsening pain after "turning arm while doing clothes" about an hour ago - broke it 3 days ago and had soft cast placed. Patient states she can't feel her fingers and has decreased motion of fingers. She also reports problems with her blood sugar. Fingers warm to touch, cap refill <3 sec. Wrapping removed in triage, no new swelling noted, joint compartments soft.

## 2017-08-06 NOTE — ED Notes (Signed)
Pt aware of need for urine specimen - states unable to do so at this time

## 2017-08-06 NOTE — ED Provider Notes (Signed)
[facility] EMERGENCY DEPARTMENT Provider Note   CSN: [id number] Arrival date & time: 08/06/17  1152     History   Chief Complaint Chief Complaint  Patient presents with  . Arm Pain  . Hypoglycemia    HPI Tonya Sanders is a 57 y.o. female.  HPI Patient fell 3 days ago and got a radial head fracture on the left elbow.  Patient reports that she woke up this morning and her splint had shifted and now she is having severe pain.  She reports she went to a cardiology follow-up appointment this morning and told them about her injury and that she was having numbness and weakness in the arm.  The advised the patient to present to the emergency department for evaluation.  Patient reports that she is getting shooting pains that last night down the forearm.  She reports the hand feels numb.  No second injury or recurrent fall. Past Medical History:  Diagnosis Date  . Anxiety   . Arthritis    "all over" (07/19/2016)  . Asthma   . Bipolar disorder (HCC)   . Chronic bronchitis (HCC)   . Chronic lower back pain   . Depression   . GERD (gastroesophageal reflux disease)   . H. pylori infection   . Hyperlipidemia   . Hypertension   . Migraine    "a few/month" (07/19/2016)  . Myocardial infarction (HCC) 2016  . Neuropathy   . Neuropathy    Hattie Perch 07/19/2016  . PUD (peptic ulcer disease)    hx/notes 06/22/2010  . Seizures (HCC)   . Stroke [facility])    "I've had several"; denies residual (07/19/2016)  . Type II diabetes mellitus [facility])     Patient Active Problem List   Diagnosis Date Noted  . Mixed hyperlipidemia due to type 2 diabetes mellitus (HCC) 06/10/2017  . Encounter for loop recorder check 06/10/2017  . Hypertension 07/25/2016  . Hyperlipidemia 07/25/2016  . Diabetes mellitus type 2 in obese (HCC) 07/25/2016  . Seizures (HCC) 07/25/2016  . Cerebellar stroke (HCC)   . 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  APPENDECTOMY    . CARDIAC CATHETERIZATION  2016  . CHOLECYSTECTOMY OPEN    . ESOPHAGOGASTRODUODENOSCOPY ENDOSCOPY  07/2005   Hattie Perch 06/22/2010  . FLEXIBLE SIGMOIDOSCOPY  07/2005   Hattie Perch 06/22/2010  . LOOP RECORDER INSERTION N/A 07/26/2016   Procedure: Loop Recorder Insertion;  Surgeon: Marinus Maw, MD;  Location: [facility] INVASIVE CV LAB;  Service: Cardiovascular;  Laterality: N/A;  . TEE WITHOUT CARDIOVERSION N/A 07/26/2016   Procedure: TRANSESOPHAGEAL ECHOCARDIOGRAM (TEE);  Surgeon: Chrystie Nose, MD;  Location: [facility] ENDOSCOPY;  Service: Cardiovascular;  Laterality: N/A;  . TUBAL LIGATION       OB History   None      Home Medications    Prior to Admission medications   Medication Sig Start Date End Date Taking? Authorizing Provider  aspirin 81 MG chewable tablet Chew 1 tablet (81 mg total) by mouth daily. 07/22/16   Darreld Mclean, MD  atorvastatin (LIPITOR) 40 MG tablet Take 1 tablet (40 mg total) by mouth daily at 6 PM. 07/21/16   Darreld Mclean, MD  clopidogrel (PLAVIX) 75 MG tablet Take 1 tablet (75 mg total) by mouth daily. 07/22/16   Darreld Mclean, MD  DULoxetine (CYMBALTA) 60 MG capsule Take 1 capsule (60 mg total) by mouth 2 (two) times daily. 07/21/16   Darreld Mclean, MD  HYDROcodone-acetaminophen (  NORCO/VICODIN) 5-325 MG tablet Take 1 tablet by mouth every 6 (six) hours as needed for severe pain. 08/03/17   Caccavale, Sophia, PA-C  HYDROcodone-acetaminophen (NORCO/VICODIN) 5-325 MG tablet Take 1-2 tablets by mouth every 4 (four) hours as needed for moderate pain or severe pain. 08/06/17   Arby Barrette, MD  hydrocortisone cream 1 % Apply to affected area 2 times daily 04/11/17   Graciella Freer A, PA-C  hydrOXYzine (ATARAX/VISTARIL) 25 MG tablet Take 0.5-1 tablets (12.5-25 mg total) by mouth every 8 (eight) hours as needed for itching. 06/12/17   Maczis, Elmer Sow, PA-C  insulin detemir (LEVEMIR) 100 UNIT/ML injection Inject 50 Units into the skin 2 (two) times daily.    [provider]  insulin lispro (HUMALOG) 100 UNIT/ML cartridge Inject 0-50 Units into the skin daily. Per Sliding scale.    [provider]  Ipratropium-Albuterol (COMBIVENT RESPIMAT) 20-100 MCG/ACT AERS respimat Inhale 1 puff into the lungs 4 (four) times daily. 06/21/17   [provider]  lisinopril (PRINIVIL,ZESTRIL) 20 MG tablet Take 1 tablet (20 mg total) by mouth daily. 06/08/17   Croitoru, Mihai, MD  lisinopril-hydrochlorothiazide (PRINZIDE,ZESTORETIC) 20-25 MG tablet Take 1 tablet by mouth daily. 06/13/17   [provider]  loperamide (IMODIUM) 2 MG capsule Take 2 capsules (4 mg total) by mouth at bedtime as needed for diarrhea or loose stools. 07/01/17   Derwood Kaplan, MD  Melatonin 5 MG TABS Take 1 tablet (5 mg total) by mouth at bedtime as needed (insomnia). 07/26/16   Molt, Bethany, DO  metFORMIN (GLUCOPHAGE) 500 MG tablet Take 1 tablet (500 mg total) by mouth 2 (two) times daily with a meal. Patient not taking: Reported on 07/03/2017 07/26/16 07/26/17  Molt, Bethany, DO  methocarbamol (ROBAXIN) 500 MG tablet Take 1 tablet (500 mg total) by mouth at bedtime as needed for muscle spasms. 08/03/17   Caccavale, Sophia, PA-C  mupirocin cream (BACTROBAN) 2 % Apply 1 application topically 2 (two) times daily. 04/11/17   Maxwell Caul, PA-C  oxyCODONE-acetaminophen (PERCOCET/ROXICET) 5-325 MG tablet Take 1 tablet by mouth every 8 (eight) hours as needed for severe pain. Patient not taking: Reported on 07/03/2017 12/29/16   Michela Pitcher A, PA-C  triamcinolone cream (KENALOG) 0.1 % Apply 1 application topically 2 (two) times daily. 06/12/17   Maczis, Elmer Sow, PA-C    Family History Family History  Problem Relation Age of Onset  . Heart disease Mother   . Heart attack Mother   . Heart failure Mother   . Hyperlipidemia Mother   . Hypertension Mother   . Cancer Mother   . COPD Mother   . Heart attack Sister   . Heart disease Sister   . Heart failure Sister   .  Hyperlipidemia Sister   . Hypertension Sister   . Cancer Sister   . Sleep apnea Sister     Social History Social History   Tobacco Use  . Smoking status: Former Games developer  . Smokeless tobacco: Never Used  . Tobacco comment: 07/19/2016 "just smoke when I get upset"  Substance Use Topics  . Alcohol use: Yes  . Drug use: No     Allergies   Doxycycline; Penicillins; Erythromycin; Nsaids; and Tomato   Review of Systems Review of Systems Contusional: No fever no chills no malaise Cardiovascular: No chest pain or syncope.  Physical Exam Updated Vital Signs BP 119/76 (BP Location: Right Arm)   Pulse 78   Temp 98.2 F (36.8 C) (Oral)   Resp 18  Ht 5\' 5"  (1.651 m)   Wt 95.3 kg (210 lb)   SpO2 100%   BMI 34.95 kg/m   Physical Exam  Constitutional: She is oriented to person, place, and time.  Patient is alert and nontoxic.  Status clear.  No respiratory distress.  She intermittently winces in pain with certain movements of the arm.  Cardiovascular: Normal rate and regular rhythm.  Pulmonary/Chest: Effort normal and breath sounds normal.  Musculoskeletal:  Patient has a long ulnar gutter splint on the right upper extremity.  Positioning is actually still in the ulnar gutter( patient perceived that it had shifted considerably to the volar aspect.)  Finger motion intact.  No significant swelling of the hand of the fingers.  Cap refill less than 2 seconds.  Neurological: She is alert and oriented to person, place, and time. Coordination normal.     ED Treatments / Results  Labs (all labs ordered are listed, but only abnormal results are displayed) Labs Reviewed  BASIC METABOLIC PANEL - Abnormal; Notable for the following components:      Result Value   Glucose, Bld 65 (*)    BUN 32 (*)    All other components within normal limits  CBG MONITORING, ED - Abnormal; Notable for the following components:   Glucose-Capillary 66 (*)    All other components within normal limits    CBG MONITORING, ED - Abnormal; Notable for the following components:   Glucose-Capillary 149 (*)    All other components within normal limits  CBG MONITORING, ED - Abnormal; Notable for the following components:   Glucose-Capillary 52 (*)    All other components within normal limits  CBC  URINALYSIS, ROUTINE W REFLEX MICROSCOPIC    EKG None  Radiology No results found.  Procedures .Splint Application Date/Time: 08/06/2017 3:50 PM Performed by: Arby Barrette, MD Authorized by: Arby Barrette, MD   Consent:    Consent obtained:  Verbal   Consent given by:  Patient   Risks discussed:  Swelling, pain, numbness and discoloration Pre-procedure details:    Sensation:  Normal   Skin color:  Normal Procedure details:    Laterality:  Left   Location:  Arm   Arm:  L lower arm   Strapping: no     Splint type:  Sugar tong   Supplies:  Ortho-Glass and cotton padding Post-procedure details:    Pain:  Improved   Sensation:  Normal   Skin color:  Normal   Patient tolerance of procedure:  Tolerated well, no immediate complications Comments:     Patient was splinted with Orthotec.  I did have the splint completely removed and examined the patient's arm.  There is no skin breakdown or severe swelling or erythema at the elbow.  consistent with moderate effusion.  Her skin surfaces and very good condition.  2+ radial pulse.  Less than 2-second capillary refill.  She reported she had numbness of the hand but had intact sensation.  She can grip to moderate strength.   (including critical care time)  Medications Ordered in ED Medications  oxyCODONE-acetaminophen (PERCOCET/ROXICET) 5-325 MG per tablet 1 tablet (has no administration in time range)  oxyCODONE-acetaminophen (PERCOCET/ROXICET) 5-325 MG per tablet 1 tablet (1 tablet Oral Given 08/06/17 1508)     Initial Impression / Assessment and Plan / ED Course  I have reviewed the triage vital signs and the nursing notes.  Pertinent  labs & imaging results that were available during my care of the patient were reviewed by me  and considered in my medical decision making (see chart for details).       Final Clinical Impressions(s) / ED Diagnoses   Final diagnoses:  Closed nondisplaced fracture of head of left radius with routine healing, subsequent encounter   Presents for reassessment of nondisplaced radial head fracture.  She was advised to come to the emergency department by her outpatient provider based on report of numbness.  Patient reported she was actually going to her orthopedic doctor but then was told to come to the emergency department.  Patient is neurovascularly intact.  She may have neuropathic pain he describes a lancinating pain that shoots in her arm and a spasm of the forearm.  Vascular exam however is normal.  Splint was completely removed and arm examined.  Patient was resplinted in a sugar tong with the assistance of the orthopedic tech.  Patient reports she has taken all of her pain medications that were dispensed.  No evident severe distress.  She tolerated responding well.  Provide 20 Vicodin and advised patient to follow-up with orthopedics as planned. ED Discharge Orders        Ordered    HYDROcodone-acetaminophen (NORCO/VICODIN) 5-325 MG tablet  Every 4 hours PRN     08/06/17 1548       Arby Barrette, MD 08/06/17 1555

## 2017-08-14 ENCOUNTER — Telehealth: Payer: Self-pay | Admitting: Cardiology

## 2017-08-14 NOTE — Telephone Encounter (Signed)
Spoke w/ pt and requested that she send a manual transmission b/c her home monitor has not updated in at least 14 days.   

## 2017-08-16 ENCOUNTER — Ambulatory Visit: Payer: Medicare Other

## 2017-08-16 NOTE — Progress Notes (Deleted)
HPI:  Tonya Sanders is a 57 y.o. female patient of Dr Royann Shivers, with a PMH below who presents today for hypertension clinic evaluation.  In addition to hypertension, her medical history is significant for insulin dependent DM2 (with neuropathy), COPD, hyperlipidemia and cryptogenic stroke in June 2018.  She states her blood pressure has been elevated ever since she was diagnosed with DM, about 8 years ago.  She has difficulty with some memory, including physician names, what her medications are and which pharmacy she uses.  She refers to her insulins as the "blue" and the "green" ones.  Her husband apparently puts her mediations into several 7 day pill minders, then will call her throughout the day to remind her to take meds.  He works in [employer], 6 days per week and long days.    In past chart notes are instances of increasing her lisinopril dose, only to have the patient come back for follow up on the lower dose. I think it has been more an issue of confusion than non-compliance.  She has no recollection of her PCP switching her lisinopril to lisinopril hctz on Monday of this week, despite the chart indicating patient was aware to go to pharmacy for new medication.    Blood Pressure Goal:  130/80  Current Medications:  Lisinopril 20 mg  Family History:  Did not know father  Mother and 1/2 sister with hypertension and diabetes  3 children, ages 60,35,33 - no known health problems  Diet:  Patient states she does not add salt to foods, eats a mix of fresh and frozen vegetables with occasional canned products.  States very little breads or pastas.  Exercise:  Previous notes indicate patient is quite inactive and that she has difficulty with walking or climbing stairs, however she tells me today that she goes to [employer] daily and walks on the treadmill for 60-90 minutes (at a pace of 2-3 mph)  Home BP readings:  No home BP cuff  Intolerances:   No cardiac intolerances  Estimated  Creatinine Clearance: 74.9 mL/min (by C-G formula based on SCr of 0.98 mg/dL).  Wt Readings from Last 3 Encounters:  08/06/17 210 lb (95.3 kg)  06/08/17 211 lb 3.2 oz (95.8 kg)  12/29/16 205 lb (93 kg)   BP Readings from Last 3 Encounters:  08/06/17 122/83  08/03/17 131/89  07/20/17 (!) 164/80   Pulse Readings from Last 3 Encounters:  08/06/17 66  08/03/17 81  07/20/17 84    Current Outpatient Medications  Medication Sig Dispense Refill  . aspirin 81 MG chewable tablet Chew 1 tablet (81 mg total) by mouth daily. 30 tablet 1  . atorvastatin (LIPITOR) 40 MG tablet Take 1 tablet (40 mg total) by mouth daily at 6 PM. 30 tablet 1  . clopidogrel (PLAVIX) 75 MG tablet Take 1 tablet (75 mg total) by mouth daily. 30 tablet 1  . DULoxetine (CYMBALTA) 60 MG capsule Take 1 capsule (60 mg total) by mouth 2 (two) times daily. 30 capsule 1  . HYDROcodone-acetaminophen (NORCO/VICODIN) 5-325 MG tablet Take 1 tablet by mouth every 6 (six) hours as needed for severe pain. 6 tablet 0  . HYDROcodone-acetaminophen (NORCO/VICODIN) 5-325 MG tablet Take 1-2 tablets by mouth every 4 (four) hours as needed for moderate pain or severe pain. 20 tablet 0  . hydrocortisone cream 1 % Apply to affected area 2 times daily 15 g 0  . hydrOXYzine (ATARAX/VISTARIL) 25 MG tablet Take 0.5-1 tablets (12.5-25 mg total) by  mouth every 8 (eight) hours as needed for itching. 20 tablet 0  . insulin detemir (LEVEMIR) 100 UNIT/ML injection Inject 50 Units into the skin 2 (two) times daily.    . insulin lispro (HUMALOG) 100 UNIT/ML cartridge Inject 0-50 Units into the skin daily. Per Sliding scale.    . Ipratropium-Albuterol (COMBIVENT RESPIMAT) 20-100 MCG/ACT AERS respimat Inhale 1 puff into the lungs 4 (four) times daily.    Marland Kitchen lisinopril (PRINIVIL,ZESTRIL) 20 MG tablet Take 1 tablet (20 mg total) by mouth daily. 90 tablet 3  . lisinopril-hydrochlorothiazide (PRINZIDE,ZESTORETIC) 20-25 MG tablet Take 1 tablet by mouth daily.    Marland Kitchen  loperamide (IMODIUM) 2 MG capsule Take 2 capsules (4 mg total) by mouth at bedtime as needed for diarrhea or loose stools. 12 capsule 0  . Melatonin 5 MG TABS Take 1 tablet (5 mg total) by mouth at bedtime as needed (insomnia). 30 tablet 1  . metFORMIN (GLUCOPHAGE) 500 MG tablet Take 1 tablet (500 mg total) by mouth 2 (two) times daily with a meal. (Patient not taking: Reported on 07/03/2017) 60 tablet 1  . methocarbamol (ROBAXIN) 500 MG tablet Take 1 tablet (500 mg total) by mouth at bedtime as needed for muscle spasms. 7 tablet 0  . mupirocin cream (BACTROBAN) 2 % Apply 1 application topically 2 (two) times daily. 15 g 0  . oxyCODONE-acetaminophen (PERCOCET/ROXICET) 5-325 MG tablet Take 1 tablet by mouth every 8 (eight) hours as needed for severe pain. (Patient not taking: Reported on 07/03/2017) 10 tablet 0  . triamcinolone cream (KENALOG) 0.1 % Apply 1 application topically 2 (two) times daily. 30 g 0   No current facility-administered medications for this visit.     Allergies  Allergen Reactions  . Doxycycline Nausea And Vomiting  . Penicillins Nausea And Vomiting    Has patient had a PCN reaction causing immediate rash, facial/tongue/throat swelling, SOB or lightheadedness with hypotension: No Has patient had a PCN reaction causing severe rash involving mucus membranes or skin necrosis: No Has patient had a PCN reaction that required hospitalization: No Has patient had a PCN reaction occurring within the last 10 years: No If all of the above answers are "NO", then may proceed with Cephalosporin use.   . Erythromycin Rash    Throat swelling  . Nsaids Nausea And Vomiting    Fatty liver  . Tomato Other (See Comments)    unspecified    Past Medical History:  Diagnosis Date  . Anxiety   . Arthritis    "all over" (07/19/2016)  . Asthma   . Bipolar disorder (HCC)   . Chronic bronchitis (HCC)   . Chronic lower back pain   . Depression   . GERD (gastroesophageal reflux disease)   .  H. pylori infection   . Hyperlipidemia   . Hypertension   . Migraine    "a few/month" (07/19/2016)  . Myocardial infarction (HCC) 2016  . Neuropathy   . Neuropathy    Hattie Perch 07/19/2016  . PUD (peptic ulcer disease)    hx/notes 06/22/2010  . Seizures (HCC)   . Stroke [facility])    "I've had several"; denies residual (07/19/2016)  . Type II diabetes mellitus (HCC)     There were no vitals taken for this visit.  No problem-specific Assessment & Plan notes found for this encounter. Fizza Scales Rodriguez-Guzman PharmD, BCPS, CPP [facility] [address] [place] [address] 08/16/2017 9:54 AM

## 2017-08-31 LAB — CUP PACEART REMOTE DEVICE CHECK
Implantable Pulse Generator Implant Date: 20180620
MDC IDC SESS DTM: 20190628094004

## 2017-09-04 ENCOUNTER — Ambulatory Visit (INDEPENDENT_AMBULATORY_CARE_PROVIDER_SITE_OTHER): Payer: Medicare Other | Admitting: *Deleted

## 2017-09-04 DIAGNOSIS — I639 Cerebral infarction, unspecified: Secondary | ICD-10-CM | POA: Diagnosis not present

## 2017-09-05 NOTE — Progress Notes (Signed)
Carelink Summary Report / Loop Recorder 

## 2017-09-18 ENCOUNTER — Other Ambulatory Visit: Payer: Self-pay | Admitting: Physician Assistant

## 2017-09-18 DIAGNOSIS — Z1231 Encounter for screening mammogram for malignant neoplasm of breast: Secondary | ICD-10-CM

## 2017-09-24 ENCOUNTER — Encounter: Payer: Medicare Other | Admitting: Cardiovascular Disease

## 2017-10-09 ENCOUNTER — Ambulatory Visit (INDEPENDENT_AMBULATORY_CARE_PROVIDER_SITE_OTHER): Payer: Medicare Other | Admitting: *Deleted

## 2017-10-09 DIAGNOSIS — I639 Cerebral infarction, unspecified: Secondary | ICD-10-CM | POA: Diagnosis not present

## 2017-10-10 ENCOUNTER — Telehealth: Payer: Self-pay | Admitting: Cardiology

## 2017-10-10 NOTE — Telephone Encounter (Signed)
LMOVM requesting that pt send manual transmission b/c home monitor has not updated in at least 14 days.    

## 2017-10-10 NOTE — Progress Notes (Signed)
Carelink Summary Report / Loop Recorder 

## 2017-10-12 ENCOUNTER — Emergency Department (HOSPITAL_COMMUNITY)
Admission: EM | Admit: 2017-10-12 | Discharge: 2017-10-12 | Disposition: A | Discharge status: Home / Self Care | Payer: Medicare Other | Attending: Emergency Medicine | Admitting: Emergency Medicine

## 2017-10-12 ENCOUNTER — Encounter (HOSPITAL_COMMUNITY): Payer: Self-pay | Admitting: Emergency Medicine

## 2017-10-12 DIAGNOSIS — E119 Type 2 diabetes mellitus without complications: Secondary | ICD-10-CM | POA: Diagnosis not present

## 2017-10-12 DIAGNOSIS — Z79899 Other long term (current) drug therapy: Secondary | ICD-10-CM | POA: Insufficient documentation

## 2017-10-12 DIAGNOSIS — Z7984 Long term (current) use of oral hypoglycemic drugs: Secondary | ICD-10-CM | POA: Insufficient documentation

## 2017-10-12 DIAGNOSIS — T63441A Toxic effect of venom of bees, accidental (unintentional), initial encounter: Secondary | ICD-10-CM | POA: Insufficient documentation

## 2017-10-12 DIAGNOSIS — I10 Essential (primary) hypertension: Secondary | ICD-10-CM | POA: Insufficient documentation

## 2017-10-12 DIAGNOSIS — Z8673 Personal history of transient ischemic attack (TIA), and cerebral infarction without residual deficits: Secondary | ICD-10-CM | POA: Diagnosis not present

## 2017-10-12 DIAGNOSIS — R0600 Dyspnea, unspecified: Secondary | ICD-10-CM | POA: Insufficient documentation

## 2017-10-12 DIAGNOSIS — Z87891 Personal history of nicotine dependence: Secondary | ICD-10-CM | POA: Insufficient documentation

## 2017-10-12 DIAGNOSIS — Z7982 Long term (current) use of aspirin: Secondary | ICD-10-CM | POA: Diagnosis not present

## 2017-10-12 DIAGNOSIS — J45909 Unspecified asthma, uncomplicated: Secondary | ICD-10-CM | POA: Diagnosis not present

## 2017-10-12 MED ORDER — DIPHENHYDRAMINE HCL 25 MG PO CAPS
25.0000 mg | ORAL_CAPSULE | Freq: Once | ORAL | Status: AC
  Administered 2017-10-12: 25 mg via ORAL
  Filled 2017-10-12: qty 1

## 2017-10-12 NOTE — ED Triage Notes (Signed)
Patient stung by a bee at anterior neck last night reports sore throat with swelling and dry cough this morning , airway intact /respirations unlabored .

## 2017-10-12 NOTE — ED Provider Notes (Signed)
[facility] EMERGENCY DEPARTMENT Provider Note   CSN: [id number] Arrival date & time: 10/12/17  4098     History   Chief Complaint Chief Complaint  Patient presents with  . Bee Sting    HPI Tonya Sanders is a 57 y.o. female.  She states she was stung by a bee to her throat last evening.  They remove the stinger.  She is tried nothing for it since then as far as medications.  She is complaining of some pain in her throat and some numbness under her chin.  She feels short of breath with it.  She has discomfort with swallowing.  She has a prior history of bee stings and her last reaction was in her 18s.  No syncope no chest pain.  The history is provided by the patient.  Allergic Reaction  Presenting symptoms: difficulty breathing, difficulty swallowing and itching   Presenting symptoms: no rash   Severity:  Moderate Duration:  1 day Prior allergic episodes:  Insect allergies Context: insect bite/sting   Relieved by:  None tried Worsened by:  Nothing Ineffective treatments:  None tried   Past Medical History:  Diagnosis Date  . Anxiety   . Arthritis    "all over" (07/19/2016)  . Asthma   . Bipolar disorder (HCC)   . Chronic bronchitis (HCC)   . Chronic lower back pain   . Depression   . GERD (gastroesophageal reflux disease)   . H. pylori infection   . Hyperlipidemia   . Hypertension   . Migraine    "a few/month" (07/19/2016)  . Myocardial infarction (HCC) 2016  . Neuropathy   . Neuropathy    Hattie Perch 07/19/2016  . PUD (peptic ulcer disease)    hx/notes 06/22/2010  . Seizures (HCC)   . Stroke [facility])    "I've had several"; denies residual (07/19/2016)  . Type II diabetes mellitus [facility])     Patient Active Problem List   Diagnosis Date Noted  . Mixed hyperlipidemia due to type 2 diabetes mellitus (HCC) 06/10/2017  . Encounter for loop recorder check 06/10/2017  . Hypertension 07/25/2016  . Hyperlipidemia 07/25/2016  . Diabetes mellitus type 2 in  obese (HCC) 07/25/2016  . Seizures (HCC) 07/25/2016  . Cerebellar stroke (HCC)   . 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 APPENDECTOMY    . CARDIAC CATHETERIZATION  2016  . CHOLECYSTECTOMY OPEN    . ESOPHAGOGASTRODUODENOSCOPY ENDOSCOPY  07/2005   Hattie Perch 06/22/2010  . FLEXIBLE SIGMOIDOSCOPY  07/2005   Hattie Perch 06/22/2010  . LOOP RECORDER INSERTION N/A 07/26/2016   Procedure: Loop Recorder Insertion;  Surgeon: Marinus Maw, MD;  Location: [facility] INVASIVE CV LAB;  Service: Cardiovascular;  Laterality: N/A;  . TEE WITHOUT CARDIOVERSION N/A 07/26/2016   Procedure: TRANSESOPHAGEAL ECHOCARDIOGRAM (TEE);  Surgeon: Chrystie Nose, MD;  Location: [facility] ENDOSCOPY;  Service: Cardiovascular;  Laterality: N/A;  . TUBAL LIGATION       OB History   None      Home Medications    Prior to Admission medications   Medication Sig Start Date End Date Taking? Authorizing Provider  aspirin 81 MG chewable tablet Chew 1 tablet (81 mg total) by mouth daily. 07/22/16   Darreld Mclean, MD  atorvastatin (LIPITOR) 40 MG tablet Take 1 tablet (40 mg total) by mouth daily at 6 PM. 07/21/16   Darreld Mclean, MD  clopidogrel (PLAVIX) 75 MG tablet Take 1 tablet (75 mg total)  by mouth daily. 07/22/16   Darreld Mclean, MD  DULoxetine (CYMBALTA) 60 MG capsule Take 1 capsule (60 mg total) by mouth 2 (two) times daily. 07/21/16   Darreld Mclean, MD  HYDROcodone-acetaminophen (NORCO/VICODIN) 5-325 MG tablet Take 1 tablet by mouth every 6 (six) hours as needed for severe pain. 08/03/17   Caccavale, Sophia, PA-C  HYDROcodone-acetaminophen (NORCO/VICODIN) 5-325 MG tablet Take 1-2 tablets by mouth every 4 (four) hours as needed for moderate pain or severe pain. 08/06/17   Arby Barrette, MD  hydrocortisone cream 1 % Apply to affected area 2 times daily 04/11/17   Graciella Freer A, PA-C  hydrOXYzine (ATARAX/VISTARIL) 25 MG tablet Take 0.5-1 tablets (12.5-25 mg total) by mouth every 8 (eight)  hours as needed for itching. 06/12/17   Maczis, Elmer Sow, PA-C  insulin detemir (LEVEMIR) 100 UNIT/ML injection Inject 50 Units into the skin 2 (two) times daily.    [provider]  insulin lispro (HUMALOG) 100 UNIT/ML cartridge Inject 0-50 Units into the skin daily. Per Sliding scale.    [provider]  Ipratropium-Albuterol (COMBIVENT RESPIMAT) 20-100 MCG/ACT AERS respimat Inhale 1 puff into the lungs 4 (four) times daily. 06/21/17   [provider]  lisinopril (PRINIVIL,ZESTRIL) 20 MG tablet Take 1 tablet (20 mg total) by mouth daily. 06/08/17   Croitoru, Mihai, MD  lisinopril-hydrochlorothiazide (PRINZIDE,ZESTORETIC) 20-25 MG tablet Take 1 tablet by mouth daily. 06/13/17   [provider]  loperamide (IMODIUM) 2 MG capsule Take 2 capsules (4 mg total) by mouth at bedtime as needed for diarrhea or loose stools. 07/01/17   Derwood Kaplan, MD  Melatonin 5 MG TABS Take 1 tablet (5 mg total) by mouth at bedtime as needed (insomnia). 07/26/16   Molt, Bethany, DO  metFORMIN (GLUCOPHAGE) 500 MG tablet Take 1 tablet (500 mg total) by mouth 2 (two) times daily with a meal. Patient not taking: Reported on 07/03/2017 07/26/16 07/26/17  Molt, Bethany, DO  methocarbamol (ROBAXIN) 500 MG tablet Take 1 tablet (500 mg total) by mouth at bedtime as needed for muscle spasms. 08/03/17   Caccavale, Sophia, PA-C  mupirocin cream (BACTROBAN) 2 % Apply 1 application topically 2 (two) times daily. 04/11/17   Maxwell Caul, PA-C  oxyCODONE-acetaminophen (PERCOCET/ROXICET) 5-325 MG tablet Take 1 tablet by mouth every 8 (eight) hours as needed for severe pain. Patient not taking: Reported on 07/03/2017 12/29/16   Michela Pitcher A, PA-C  triamcinolone cream (KENALOG) 0.1 % Apply 1 application topically 2 (two) times daily. 06/12/17   Maczis, Elmer Sow, PA-C    Family History Family History  Problem Relation Age of Onset  . Heart disease Mother   . Heart attack Mother   . Heart failure Mother   .  Hyperlipidemia Mother   . Hypertension Mother   . Cancer Mother   . COPD Mother   . Heart attack Sister   . Heart disease Sister   . Heart failure Sister   . Hyperlipidemia Sister   . Hypertension Sister   . Cancer Sister   . Sleep apnea Sister     Social History Social History   Tobacco Use  . Smoking status: Former Games developer  . Smokeless tobacco: Never Used  . Tobacco comment: 07/19/2016 "just smoke when I get upset"  Substance Use Topics  . Alcohol use: Yes  . Drug use: No     Allergies   Doxycycline; Penicillins; Erythromycin; Nsaids; and Tomato   Review of Systems Review of Systems  Constitutional: Negative for fever.  HENT: Positive for sore throat and trouble swallowing.   Eyes: Negative for visual disturbance.  Respiratory: Positive for shortness of breath.   Cardiovascular: Negative for chest pain.  Gastrointestinal: Negative for abdominal pain.  Genitourinary: Negative for dysuria.  Musculoskeletal: Positive for neck pain.  Skin: Positive for itching. Negative for rash.  Neurological: Negative for headaches.     Physical Exam Updated Vital Signs BP 113/76 (BP Location: Right Arm)   Pulse 92   Temp 98 F (36.7 C) (Oral)   Resp 16   SpO2 100%   Physical Exam  Constitutional: She appears well-developed and well-nourished. No distress.  HENT:  Head: Normocephalic and atraumatic.  Mouth/Throat: Uvula is midline, oropharynx is clear and moist and mucous membranes are normal.  Eyes: Conjunctivae are normal.  Neck: Neck supple.  She is got a sting mark about midline anterior neck.  It is diffusely tender but there is no erythema warmth or crepitus.  Trachea is midline no stridor.  Full range of motion with some discomfort.  Normal phonation.  Cardiovascular: Normal rate, regular rhythm and normal heart sounds.  No murmur heard. Pulmonary/Chest: Effort normal and breath sounds normal. No respiratory distress.  Abdominal: Soft. There is no tenderness.    Musculoskeletal: She exhibits no edema.  Neurological: She is alert.  Skin: Skin is warm and dry. Capillary refill takes less than 2 seconds. No rash noted.  Psychiatric: She has a normal mood and affect.  Nursing note and vitals reviewed.    ED Treatments / Results  Labs (all labs ordered are listed, but only abnormal results are displayed) Labs Reviewed - No data to display  EKG None  Radiology No results found.  Procedures Procedures (including critical care time)  Medications Ordered in ED Medications - No data to display   Initial Impression / Assessment and Plan / ED Course  I have reviewed the triage vital signs and the nursing notes.  Pertinent labs & imaging results that were available during my care of the patient were reviewed by me and considered in my medical decision making (see chart for details).    57 year old female with history of bee sting allergy here after a bee sting to her throat that occurred last evening.  She is complaining of some localized pain where she got stung in her anterior neck.  Her pulse ox 100% and she is in no respiratory distress.  I think this is all local reaction.  No indication for epi.  Recommended to her Tylenol and ibuprofen but she says she cannot take that due to her liver problems.  I also ordered for a dose of Benadryl which she is going to take.  Final Clinical Impressions(s) / ED Diagnoses   Final diagnoses:  Bee sting reaction, accidental or unintentional, initial encounter    ED Discharge Orders    None       Terrilee Files, MD 10/12/17 [phone]

## 2017-10-12 NOTE — Discharge Instructions (Addendum)
You were evaluated in the emergency department for a bee sting to your throat.  The symptoms that you are having of pain and itching and soreness at the sting area or a local reaction and will improve with time.  You can do a cool compress to the area and Benadryl 1 tablet every 6 hours as needed for itching.  Please return for any concerns.

## 2017-10-12 NOTE — ED Notes (Signed)
Patient given discharge instructions and verbalized understanding.  Patient stable to discharge at this time.  Patient is alert and oriented to baseline.  No distressed noted at this time.  All belongings taken with the patient at discharge.   

## 2017-10-16 LAB — CUP PACEART REMOTE DEVICE CHECK
Date Time Interrogation Session: 20190731093627
MDC IDC PG IMPLANT DT: 20180620

## 2017-10-23 ENCOUNTER — Telehealth: Payer: Self-pay | Admitting: Cardiology

## 2017-10-23 NOTE — Telephone Encounter (Signed)
Spoke w/ pt and requested that she send a manual transmission b/c her home monitor has not updated in at least 14 days.   

## 2017-11-02 LAB — CUP PACEART REMOTE DEVICE CHECK
Implantable Pulse Generator Implant Date: 20180620
MDC IDC SESS DTM: 20190902100847

## 2017-11-07 ENCOUNTER — Telehealth: Payer: Self-pay | Admitting: Cardiology

## 2017-11-07 NOTE — Telephone Encounter (Signed)
Spoke w/ pt and requested that she send a manual transmission b/c her home monitor has not updated in at least 14 days.   

## 2017-11-12 ENCOUNTER — Ambulatory Visit (INDEPENDENT_AMBULATORY_CARE_PROVIDER_SITE_OTHER): Payer: Medicare Other | Admitting: *Deleted

## 2017-11-12 DIAGNOSIS — I639 Cerebral infarction, unspecified: Secondary | ICD-10-CM | POA: Diagnosis not present

## 2017-11-12 NOTE — Progress Notes (Signed)
Carelink Summary Report / Loop Recorder 

## 2017-11-22 ENCOUNTER — Emergency Department (HOSPITAL_COMMUNITY): Payer: Medicare Other

## 2017-11-22 ENCOUNTER — Observation Stay (HOSPITAL_COMMUNITY)
Admission: EM | Admit: 2017-11-22 | Discharge: 2017-11-23 | Disposition: A | Discharge status: Home Health | Payer: Medicare Other | Attending: Internal Medicine | Admitting: Internal Medicine

## 2017-11-22 ENCOUNTER — Other Ambulatory Visit: Payer: Self-pay

## 2017-11-22 ENCOUNTER — Encounter (HOSPITAL_COMMUNITY): Payer: Self-pay

## 2017-11-22 DIAGNOSIS — Z79899 Other long term (current) drug therapy: Secondary | ICD-10-CM | POA: Insufficient documentation

## 2017-11-22 DIAGNOSIS — M5136 Other intervertebral disc degeneration, lumbar region: Secondary | ICD-10-CM | POA: Diagnosis not present

## 2017-11-22 DIAGNOSIS — G8929 Other chronic pain: Secondary | ICD-10-CM | POA: Diagnosis not present

## 2017-11-22 DIAGNOSIS — I639 Cerebral infarction, unspecified: Secondary | ICD-10-CM | POA: Diagnosis present

## 2017-11-22 DIAGNOSIS — F319 Bipolar disorder, unspecified: Secondary | ICD-10-CM | POA: Insufficient documentation

## 2017-11-22 DIAGNOSIS — Z8249 Family history of ischemic heart disease and other diseases of the circulatory system: Secondary | ICD-10-CM | POA: Insufficient documentation

## 2017-11-22 DIAGNOSIS — Z8711 Personal history of peptic ulcer disease: Secondary | ICD-10-CM | POA: Diagnosis not present

## 2017-11-22 DIAGNOSIS — Z79891 Long term (current) use of opiate analgesic: Secondary | ICD-10-CM | POA: Diagnosis not present

## 2017-11-22 DIAGNOSIS — M4856XA Collapsed vertebra, not elsewhere classified, lumbar region, initial encounter for fracture: Secondary | ICD-10-CM | POA: Diagnosis not present

## 2017-11-22 DIAGNOSIS — J449 Chronic obstructive pulmonary disease, unspecified: Secondary | ICD-10-CM | POA: Diagnosis not present

## 2017-11-22 DIAGNOSIS — Z7902 Long term (current) use of antithrombotics/antiplatelets: Secondary | ICD-10-CM | POA: Insufficient documentation

## 2017-11-22 DIAGNOSIS — G40909 Epilepsy, unspecified, not intractable, without status epilepticus: Secondary | ICD-10-CM | POA: Insufficient documentation

## 2017-11-22 DIAGNOSIS — I119 Hypertensive heart disease without heart failure: Secondary | ICD-10-CM | POA: Insufficient documentation

## 2017-11-22 DIAGNOSIS — Z87891 Personal history of nicotine dependence: Secondary | ICD-10-CM | POA: Insufficient documentation

## 2017-11-22 DIAGNOSIS — S32000A Wedge compression fracture of unspecified lumbar vertebra, initial encounter for closed fracture: Secondary | ICD-10-CM

## 2017-11-22 DIAGNOSIS — I10 Essential (primary) hypertension: Secondary | ICD-10-CM | POA: Diagnosis present

## 2017-11-22 DIAGNOSIS — Z6837 Body mass index (BMI) 37.0-37.9, adult: Secondary | ICD-10-CM | POA: Insufficient documentation

## 2017-11-22 DIAGNOSIS — Z794 Long term (current) use of insulin: Secondary | ICD-10-CM | POA: Diagnosis not present

## 2017-11-22 DIAGNOSIS — D649 Anemia, unspecified: Secondary | ICD-10-CM

## 2017-11-22 DIAGNOSIS — S2241XA Multiple fractures of ribs, right side, initial encounter for closed fracture: Secondary | ICD-10-CM | POA: Diagnosis not present

## 2017-11-22 DIAGNOSIS — I2510 Atherosclerotic heart disease of native coronary artery without angina pectoris: Secondary | ICD-10-CM | POA: Diagnosis not present

## 2017-11-22 DIAGNOSIS — E782 Mixed hyperlipidemia: Secondary | ICD-10-CM | POA: Insufficient documentation

## 2017-11-22 DIAGNOSIS — E1140 Type 2 diabetes mellitus with diabetic neuropathy, unspecified: Secondary | ICD-10-CM | POA: Insufficient documentation

## 2017-11-22 DIAGNOSIS — G9389 Other specified disorders of brain: Secondary | ICD-10-CM | POA: Insufficient documentation

## 2017-11-22 DIAGNOSIS — Z8673 Personal history of transient ischemic attack (TIA), and cerebral infarction without residual deficits: Secondary | ICD-10-CM | POA: Insufficient documentation

## 2017-11-22 DIAGNOSIS — Z881 Allergy status to other antibiotic agents status: Secondary | ICD-10-CM | POA: Insufficient documentation

## 2017-11-22 DIAGNOSIS — R569 Unspecified convulsions: Secondary | ICD-10-CM

## 2017-11-22 DIAGNOSIS — Z886 Allergy status to analgesic agent status: Secondary | ICD-10-CM | POA: Insufficient documentation

## 2017-11-22 DIAGNOSIS — I252 Old myocardial infarction: Secondary | ICD-10-CM | POA: Diagnosis not present

## 2017-11-22 DIAGNOSIS — E785 Hyperlipidemia, unspecified: Secondary | ICD-10-CM | POA: Diagnosis present

## 2017-11-22 DIAGNOSIS — E669 Obesity, unspecified: Secondary | ICD-10-CM | POA: Diagnosis not present

## 2017-11-22 DIAGNOSIS — S2239XA Fracture of one rib, unspecified side, initial encounter for closed fracture: Secondary | ICD-10-CM | POA: Diagnosis present

## 2017-11-22 DIAGNOSIS — S2231XA Fracture of one rib, right side, initial encounter for closed fracture: Secondary | ICD-10-CM

## 2017-11-22 DIAGNOSIS — Z7982 Long term (current) use of aspirin: Secondary | ICD-10-CM | POA: Insufficient documentation

## 2017-11-22 DIAGNOSIS — E1169 Type 2 diabetes mellitus with other specified complication: Secondary | ICD-10-CM | POA: Diagnosis present

## 2017-11-22 DIAGNOSIS — Z880 Allergy status to penicillin: Secondary | ICD-10-CM | POA: Insufficient documentation

## 2017-11-22 DIAGNOSIS — S2249XA Multiple fractures of ribs, unspecified side, initial encounter for closed fracture: Secondary | ICD-10-CM | POA: Diagnosis present

## 2017-11-22 LAB — CBC
HCT: 33.5 % — ABNORMAL LOW (ref 36.0–46.0)
Hemoglobin: 11.2 g/dL — ABNORMAL LOW (ref 12.0–15.0)
MCH: 32.1 pg (ref 26.0–34.0)
MCHC: 33.4 g/dL (ref 30.0–36.0)
MCV: 96 fL (ref 80.0–100.0)
NRBC: 0 % (ref 0.0–0.2)
PLATELETS: 341 10*3/uL (ref 150–400)
RBC: 3.49 MIL/uL — ABNORMAL LOW (ref 3.87–5.11)
RDW: 12.7 % (ref 11.5–15.5)
WBC: 9.2 10*3/uL (ref 4.0–10.5)

## 2017-11-22 LAB — I-STAT TROPONIN, ED
Troponin i, poc: 0 ng/mL (ref 0.00–0.08)
Troponin i, poc: 0.01 ng/mL (ref 0.00–0.08)

## 2017-11-22 LAB — BASIC METABOLIC PANEL
Anion gap: 10 (ref 5–15)
BUN: 21 mg/dL — ABNORMAL HIGH (ref 6–20)
CO2: 26 mmol/L (ref 22–32)
CREATININE: 0.96 mg/dL (ref 0.44–1.00)
Calcium: 9.5 mg/dL (ref 8.9–10.3)
Chloride: 106 mmol/L (ref 98–111)
GFR calc non Af Amer: >60 mL/min (ref >60)
Glucose, Bld: 183 mg/dL — ABNORMAL HIGH (ref 70–99)
Potassium: 4 mmol/L (ref 3.5–5.1)
SODIUM: 142 mmol/L (ref 135–145)

## 2017-11-22 LAB — I-STAT BETA HCG BLOOD, ED (MC, WL, AP ONLY)

## 2017-11-22 MED ORDER — IPRATROPIUM-ALBUTEROL 0.5-2.5 (3) MG/3ML IN SOLN
3.0000 mL | Freq: Four times a day (QID) | RESPIRATORY_TRACT | Status: DC
  Administered 2017-11-22: 3 mL via RESPIRATORY_TRACT
  Filled 2017-11-22: qty 3

## 2017-11-22 MED ORDER — ACETAMINOPHEN 325 MG PO TABS
650.0000 mg | ORAL_TABLET | Freq: Once | ORAL | Status: AC
  Administered 2017-11-22: 650 mg via ORAL
  Filled 2017-11-22: qty 2

## 2017-11-22 MED ORDER — INSULIN GLARGINE 100 UNIT/ML ~~LOC~~ SOLN
15.0000 [IU] | Freq: Two times a day (BID) | SUBCUTANEOUS | Status: DC
  Administered 2017-11-23: 15 [IU] via SUBCUTANEOUS
  Filled 2017-11-22 (×2): qty 0.15

## 2017-11-22 MED ORDER — LISINOPRIL 20 MG PO TABS
20.0000 mg | ORAL_TABLET | Freq: Every day | ORAL | Status: DC
  Administered 2017-11-23: 20 mg via ORAL
  Filled 2017-11-22: qty 1

## 2017-11-22 MED ORDER — LISINOPRIL-HYDROCHLOROTHIAZIDE 20-25 MG PO TABS: 1.0000 | ORAL_TABLET | Freq: Every day | ORAL | Status: DC

## 2017-11-22 MED ORDER — DIVALPROEX SODIUM 500 MG PO DR TAB: 500.0000 mg | DELAYED_RELEASE_TABLET | ORAL | Status: DC

## 2017-11-22 MED ORDER — IOHEXOL 300 MG/ML  SOLN
75.0000 mL | Freq: Once | INTRAMUSCULAR | Status: AC | PRN
  Administered 2017-11-22: 75 mL via INTRAVENOUS

## 2017-11-22 MED ORDER — TRAMADOL HCL 50 MG PO TABS
50.0000 mg | ORAL_TABLET | Freq: Four times a day (QID) | ORAL | Status: DC | PRN
  Administered 2017-11-23 (×2): 50 mg via ORAL
  Filled 2017-11-22 (×2): qty 1

## 2017-11-22 MED ORDER — ASPIRIN 81 MG PO CHEW
81.0000 mg | CHEWABLE_TABLET | Freq: Every day | ORAL | Status: DC
  Administered 2017-11-23: 81 mg via ORAL
  Filled 2017-11-22: qty 1

## 2017-11-22 MED ORDER — HYDROCHLOROTHIAZIDE 25 MG PO TABS
25.0000 mg | ORAL_TABLET | Freq: Every day | ORAL | Status: DC
  Filled 2017-11-22: qty 1

## 2017-11-22 MED ORDER — ACETAMINOPHEN 325 MG PO TABS: 650.0000 mg | ORAL_TABLET | Freq: Four times a day (QID) | ORAL | Status: DC | PRN

## 2017-11-22 MED ORDER — CLOPIDOGREL BISULFATE 75 MG PO TABS
75.0000 mg | ORAL_TABLET | Freq: Every day | ORAL | Status: DC
  Administered 2017-11-23: 75 mg via ORAL
  Filled 2017-11-22: qty 1

## 2017-11-22 MED ORDER — INSULIN ASPART 100 UNIT/ML ~~LOC~~ SOLN
0.0000 [IU] | Freq: Three times a day (TID) | SUBCUTANEOUS | Status: DC
  Administered 2017-11-23 (×2): 3 [IU] via SUBCUTANEOUS

## 2017-11-22 MED ORDER — METHOCARBAMOL 500 MG PO TABS
500.0000 mg | ORAL_TABLET | Freq: Every evening | ORAL | Status: DC | PRN
  Administered 2017-11-23: 500 mg via ORAL
  Filled 2017-11-22: qty 1

## 2017-11-22 MED ORDER — MORPHINE SULFATE (PF) 4 MG/ML IV SOLN
4.0000 mg | Freq: Once | INTRAVENOUS | Status: AC
  Administered 2017-11-22: 4 mg via INTRAVENOUS
  Filled 2017-11-22: qty 1

## 2017-11-22 MED ORDER — ATORVASTATIN CALCIUM 40 MG PO TABS: 40.0000 mg | ORAL_TABLET | Freq: Every day | ORAL | Status: DC

## 2017-11-22 MED ORDER — DIVALPROEX SODIUM 500 MG PO DR TAB: 500.0000 mg | DELAYED_RELEASE_TABLET | Freq: Every day | ORAL | Status: DC

## 2017-11-22 MED ORDER — DULOXETINE HCL 60 MG PO CPEP
60.0000 mg | ORAL_CAPSULE | Freq: Two times a day (BID) | ORAL | Status: DC
  Administered 2017-11-23 (×2): 60 mg via ORAL
  Filled 2017-11-22 (×2): qty 1

## 2017-11-22 MED ORDER — ENOXAPARIN SODIUM 40 MG/0.4ML ~~LOC~~ SOLN
40.0000 mg | SUBCUTANEOUS | Status: DC
  Administered 2017-11-23: 40 mg via SUBCUTANEOUS
  Filled 2017-11-22: qty 0.4

## 2017-11-22 MED ORDER — IPRATROPIUM-ALBUTEROL 0.5-2.5 (3) MG/3ML IN SOLN
3.0000 mL | Freq: Two times a day (BID) | RESPIRATORY_TRACT | Status: DC
  Administered 2017-11-23: 3 mL via RESPIRATORY_TRACT
  Filled 2017-11-22: qty 3

## 2017-11-22 MED ORDER — DIVALPROEX SODIUM 500 MG PO DR TAB
1000.0000 mg | DELAYED_RELEASE_TABLET | Freq: Every morning | ORAL | Status: DC
  Administered 2017-11-23: 1000 mg via ORAL
  Filled 2017-11-22: qty 2

## 2017-11-22 MED ORDER — MELATONIN 3 MG PO TABS
4.5000 mg | ORAL_TABLET | Freq: Every evening | ORAL | Status: DC | PRN
  Filled 2017-11-22: qty 1.5

## 2017-11-22 MED ORDER — MORPHINE SULFATE (PF) 4 MG/ML IV SOLN
4.0000 mg | INTRAVENOUS | Status: DC | PRN
  Administered 2017-11-22 – 2017-11-23 (×2): 4 mg via INTRAVENOUS
  Filled 2017-11-22 (×2): qty 1

## 2017-11-22 MED ORDER — IPRATROPIUM-ALBUTEROL 20-100 MCG/ACT IN AERS: 1.0000 | INHALATION_SPRAY | Freq: Four times a day (QID) | RESPIRATORY_TRACT | Status: DC

## 2017-11-22 NOTE — ED Notes (Signed)
Called for pt 2x in waiting room, pt did not answer. Husband is in room.
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
## 2017-11-22 NOTE — ED Notes (Signed)
Patient transported to CT 

## 2017-11-22 NOTE — ED Notes (Signed)
Pt placed in gown.
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
## 2017-11-22 NOTE — ED Notes (Signed)
Pt transported to CT with Wright PA

## 2017-11-23 ENCOUNTER — Observation Stay (HOSPITAL_COMMUNITY): Payer: Medicare Other

## 2017-11-23 ENCOUNTER — Encounter (HOSPITAL_COMMUNITY): Payer: Self-pay

## 2017-11-23 ENCOUNTER — Other Ambulatory Visit: Payer: Self-pay

## 2017-11-23 DIAGNOSIS — E1169 Type 2 diabetes mellitus with other specified complication: Secondary | ICD-10-CM | POA: Diagnosis not present

## 2017-11-23 DIAGNOSIS — S32000A Wedge compression fracture of unspecified lumbar vertebra, initial encounter for closed fracture: Secondary | ICD-10-CM

## 2017-11-23 DIAGNOSIS — S2241XA Multiple fractures of ribs, right side, initial encounter for closed fracture: Secondary | ICD-10-CM | POA: Diagnosis not present

## 2017-11-23 DIAGNOSIS — I10 Essential (primary) hypertension: Secondary | ICD-10-CM

## 2017-11-23 DIAGNOSIS — E669 Obesity, unspecified: Secondary | ICD-10-CM | POA: Diagnosis not present

## 2017-11-23 DIAGNOSIS — R569 Unspecified convulsions: Secondary | ICD-10-CM

## 2017-11-23 DIAGNOSIS — J449 Chronic obstructive pulmonary disease, unspecified: Secondary | ICD-10-CM

## 2017-11-23 DIAGNOSIS — D649 Anemia, unspecified: Secondary | ICD-10-CM

## 2017-11-23 LAB — CBC
HEMATOCRIT: 31.1 % — AB (ref 36.0–46.0)
Hemoglobin: 10.2 g/dL — ABNORMAL LOW (ref 12.0–15.0)
MCH: 31.4 pg (ref 26.0–34.0)
MCHC: 32.8 g/dL (ref 30.0–36.0)
MCV: 95.7 fL (ref 80.0–100.0)
PLATELETS: 308 10*3/uL (ref 150–400)
RBC: 3.25 MIL/uL — ABNORMAL LOW (ref 3.87–5.11)
RDW: 12.5 % (ref 11.5–15.5)
WBC: 11.3 10*3/uL — ABNORMAL HIGH (ref 4.0–10.5)
nRBC: 0 % (ref 0.0–0.2)

## 2017-11-23 LAB — HIV ANTIBODY (ROUTINE TESTING W REFLEX): HIV Screen 4th Generation wRfx: NONREACTIVE

## 2017-11-23 LAB — GLUCOSE, CAPILLARY
GLUCOSE-CAPILLARY: 192 mg/dL — AB (ref 70–99)
GLUCOSE-CAPILLARY: 163 mg/dL — AB (ref 70–99)
Glucose-Capillary: 152 mg/dL — ABNORMAL HIGH (ref 70–99)

## 2017-11-23 LAB — HEMOGLOBIN A1C
Hgb A1c MFr Bld: 8.2 % — ABNORMAL HIGH (ref 4.8–5.6)
MEAN PLASMA GLUCOSE: 188.64 mg/dL

## 2017-11-23 MED ORDER — METHOCARBAMOL 500 MG PO TABS: 500.0000 mg | ORAL_TABLET | Freq: Three times a day (TID) | ORAL | 0 refills | Status: AC | PRN

## 2017-11-23 MED ORDER — ACETAMINOPHEN 500 MG PO TABS
1000.0000 mg | ORAL_TABLET | Freq: Three times a day (TID) | ORAL | Status: DC
  Administered 2017-11-23: 1000 mg via ORAL
  Filled 2017-11-23: qty 2

## 2017-11-23 MED ORDER — OXYCODONE HCL 5 MG PO TABS
5.0000 mg | ORAL_TABLET | ORAL | Status: DC | PRN
  Administered 2017-11-23 (×2): 10 mg via ORAL
  Filled 2017-11-23 (×2): qty 2

## 2017-11-23 MED ORDER — METHOCARBAMOL 500 MG PO TABS
500.0000 mg | ORAL_TABLET | Freq: Four times a day (QID) | ORAL | Status: DC
  Administered 2017-11-23 (×2): 500 mg via ORAL
  Filled 2017-11-23 (×2): qty 1

## 2017-11-23 MED ORDER — HYDROCODONE-ACETAMINOPHEN 5-325 MG PO TABS: 1.0000 | ORAL_TABLET | ORAL | Status: DC | PRN

## 2017-11-23 MED ORDER — OXYCODONE HCL 5 MG PO TABS: 5.0000 mg | ORAL_TABLET | ORAL | 0 refills | Status: AC | PRN

## 2017-11-23 MED ORDER — GADOBUTROL 1 MMOL/ML IV SOLN
10.0000 mL | Freq: Once | INTRAVENOUS | Status: AC | PRN
  Administered 2017-11-23: 10 mL via INTRAVENOUS

## 2017-11-23 MED ORDER — INSULIN GLARGINE 100 UNIT/ML ~~LOC~~ SOLN
25.0000 [IU] | Freq: Two times a day (BID) | SUBCUTANEOUS | Status: DC
  Administered 2017-11-23: 25 [IU] via SUBCUTANEOUS
  Filled 2017-11-23 (×2): qty 0.25

## 2017-11-23 MED ORDER — ACETAMINOPHEN 500 MG PO TABS: 1000.0000 mg | ORAL_TABLET | Freq: Three times a day (TID) | ORAL | 0 refills | Status: AC

## 2017-11-23 MED ORDER — HYDROCODONE-ACETAMINOPHEN 5-325 MG PO TABS: 1.0000 | ORAL_TABLET | ORAL | 0 refills | Status: AC | PRN

## 2017-11-23 MED ORDER — MORPHINE SULFATE (PF) 2 MG/ML IV SOLN: 1.0000 mg | INTRAVENOUS | Status: DC | PRN

## 2017-11-23 MED ORDER — METHOCARBAMOL 500 MG PO TABS
500.0000 mg | ORAL_TABLET | Freq: Four times a day (QID) | ORAL | Status: DC | PRN
  Filled 2017-11-23: qty 1
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
## 2017-11-23 NOTE — Progress Notes (Signed)
Patient discharging home today. Discharge instructions explained to patient and she verbalized understanding. Took all personal belongings. o further questions or concerns voiced.

## 2017-11-23 NOTE — Progress Notes (Signed)
PT Cancellation Note  Patient Details Name: Tonya Sanders MRN: [id number] DOB: [birth year]   Cancelled Treatment:    Reason Eval/Treat Not Completed: Other (comment); RN reports scheduled for MRI in about 30 minutes to wait until after MRI prior to mobilizing.  Will attempt later as time permits.   Elray Mcgregor 11/23/2017, 9:38 AM Sheran Lawless, PT Acute Rehabilitation Services [phone] 11/23/2017
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
## 2017-11-23 NOTE — Plan of Care (Signed)

## 2017-11-26 LAB — CUP PACEART REMOTE DEVICE CHECK
Date Time Interrogation Session: 20191005100646
MDC IDC PG IMPLANT DT: 20180620

## 2017-12-03 ENCOUNTER — Telehealth: Payer: Self-pay | Admitting: Cardiology

## 2017-12-03 NOTE — Telephone Encounter (Signed)
Spoke w/ pt and requested that she send a manual transmission b/c her home monitor has not updated in at least 14 days.   

## 2017-12-13 ENCOUNTER — Ambulatory Visit (INDEPENDENT_AMBULATORY_CARE_PROVIDER_SITE_OTHER): Payer: Medicare Other | Admitting: *Deleted

## 2017-12-13 DIAGNOSIS — I639 Cerebral infarction, unspecified: Secondary | ICD-10-CM | POA: Diagnosis not present

## 2017-12-13 NOTE — Progress Notes (Signed)
Carelink Summary Report / Loop Recorder 

## 2018-01-15 ENCOUNTER — Ambulatory Visit (INDEPENDENT_AMBULATORY_CARE_PROVIDER_SITE_OTHER): Payer: Medicare Other

## 2018-01-15 DIAGNOSIS — I639 Cerebral infarction, unspecified: Secondary | ICD-10-CM

## 2018-01-16 NOTE — Progress Notes (Signed)
Carelink Summary Report / Loop Recorder 

## 2018-01-21 ENCOUNTER — Telehealth: Payer: Self-pay | Admitting: Cardiology

## 2018-01-21 NOTE — Telephone Encounter (Signed)
Spoke w/ pt and requested that she send a manual transmission b/c her home monitor has not updated in at least 14 days.   

## 2018-02-02 LAB — CUP PACEART REMOTE DEVICE CHECK
Date Time Interrogation Session: 20191107104100
MDC IDC PG IMPLANT DT: 20180620

## 2018-02-18 ENCOUNTER — Ambulatory Visit (INDEPENDENT_AMBULATORY_CARE_PROVIDER_SITE_OTHER): Payer: Medicare Other

## 2018-02-18 DIAGNOSIS — I639 Cerebral infarction, unspecified: Secondary | ICD-10-CM | POA: Diagnosis not present

## 2018-02-18 NOTE — Progress Notes (Signed)
Carelink Summary Report / Loop Recorder 

## 2018-02-19 LAB — CUP PACEART REMOTE DEVICE CHECK
Date Time Interrogation Session: 20200112110551
Implantable Pulse Generator Implant Date: 20180620

## 2018-02-24 LAB — CUP PACEART REMOTE DEVICE CHECK
Date Time Interrogation Session: 20191210103606
Implantable Pulse Generator Implant Date: 20180620

## 2018-03-22 ENCOUNTER — Ambulatory Visit: Payer: Medicare Other

## 2018-04-02 ENCOUNTER — Telehealth: Payer: Self-pay

## 2018-04-02 NOTE — Telephone Encounter (Signed)
I called the pt because we have not received a transmission from her since 01/28/2018. Pt thought she was sending a manual transmission once a week but we have not received it. I gave pt the number to [facility] tech support and told her if she has any issues to give [place] a call.

## 2018-04-02 NOTE — Progress Notes (Signed)
Carelink Summary Report / Loop Recorder 

## 2018-04-24 ENCOUNTER — Other Ambulatory Visit: Payer: Self-pay

## 2018-04-24 ENCOUNTER — Ambulatory Visit (INDEPENDENT_AMBULATORY_CARE_PROVIDER_SITE_OTHER): Payer: Medicare Other | Admitting: *Deleted

## 2018-04-24 DIAGNOSIS — I639 Cerebral infarction, unspecified: Secondary | ICD-10-CM

## 2018-04-25 LAB — CUP PACEART REMOTE DEVICE CHECK
Implantable Pulse Generator Implant Date: 20180620
MDC IDC SESS DTM: 20200318124144

## 2018-05-02 NOTE — Progress Notes (Signed)
Carelink Summary Report / Loop Recorder 

## 2018-05-27 ENCOUNTER — Other Ambulatory Visit: Payer: Self-pay

## 2018-05-27 ENCOUNTER — Ambulatory Visit (INDEPENDENT_AMBULATORY_CARE_PROVIDER_SITE_OTHER): Payer: Medicare Other | Admitting: *Deleted

## 2018-05-27 DIAGNOSIS — I639 Cerebral infarction, unspecified: Secondary | ICD-10-CM

## 2018-05-27 LAB — CUP PACEART REMOTE DEVICE CHECK
Date Time Interrogation Session: 20200420141150
Implantable Pulse Generator Implant Date: 20180620

## 2018-06-03 NOTE — Progress Notes (Signed)
Carelink Summary Report / Loop Recorder 

## 2018-06-14 ENCOUNTER — Telehealth: Payer: Self-pay

## 2018-06-14 NOTE — Telephone Encounter (Signed)
Left message for patient to remind of disconnected monitor 

## 2018-07-01 LAB — CUP PACEART REMOTE DEVICE CHECK
Date Time Interrogation Session: 20200523141043
Implantable Pulse Generator Implant Date: 20180620

## 2018-07-02 ENCOUNTER — Ambulatory Visit (INDEPENDENT_AMBULATORY_CARE_PROVIDER_SITE_OTHER): Payer: Medicare Other | Admitting: *Deleted

## 2018-07-02 DIAGNOSIS — I639 Cerebral infarction, unspecified: Secondary | ICD-10-CM | POA: Diagnosis not present

## 2018-07-09 NOTE — Progress Notes (Signed)
Carelink Summary Report / Loop Recorder 

## 2018-08-01 ENCOUNTER — Ambulatory Visit (INDEPENDENT_AMBULATORY_CARE_PROVIDER_SITE_OTHER): Payer: Medicare Other | Admitting: *Deleted

## 2018-08-01 DIAGNOSIS — I639 Cerebral infarction, unspecified: Secondary | ICD-10-CM

## 2018-08-01 LAB — CUP PACEART REMOTE DEVICE CHECK
Date Time Interrogation Session: 20200625124800
Implantable Pulse Generator Implant Date: 20180620

## 2018-08-08 NOTE — Progress Notes (Signed)
Carelink Summary Report / Loop Recorder 

## 2018-08-16 ENCOUNTER — Emergency Department (HOSPITAL_COMMUNITY): Payer: Medicare Other

## 2018-08-16 ENCOUNTER — Other Ambulatory Visit: Payer: Self-pay

## 2018-08-16 ENCOUNTER — Encounter (HOSPITAL_COMMUNITY): Payer: Self-pay

## 2018-08-16 ENCOUNTER — Emergency Department (HOSPITAL_COMMUNITY)
Admission: EM | Admit: 2018-08-16 | Discharge: 2018-08-16 | Disposition: A | Discharge status: Home / Self Care | Payer: Medicare Other | Attending: Emergency Medicine | Admitting: Emergency Medicine

## 2018-08-16 DIAGNOSIS — J45909 Unspecified asthma, uncomplicated: Secondary | ICD-10-CM | POA: Insufficient documentation

## 2018-08-16 DIAGNOSIS — Z794 Long term (current) use of insulin: Secondary | ICD-10-CM | POA: Diagnosis not present

## 2018-08-16 DIAGNOSIS — Z87891 Personal history of nicotine dependence: Secondary | ICD-10-CM | POA: Diagnosis not present

## 2018-08-16 DIAGNOSIS — I10 Essential (primary) hypertension: Secondary | ICD-10-CM | POA: Insufficient documentation

## 2018-08-16 DIAGNOSIS — E119 Type 2 diabetes mellitus without complications: Secondary | ICD-10-CM | POA: Diagnosis not present

## 2018-08-16 DIAGNOSIS — Z79899 Other long term (current) drug therapy: Secondary | ICD-10-CM | POA: Diagnosis not present

## 2018-08-16 DIAGNOSIS — Z7901 Long term (current) use of anticoagulants: Secondary | ICD-10-CM | POA: Insufficient documentation

## 2018-08-16 DIAGNOSIS — Z8673 Personal history of transient ischemic attack (TIA), and cerebral infarction without residual deficits: Secondary | ICD-10-CM | POA: Diagnosis not present

## 2018-08-16 DIAGNOSIS — R51 Headache: Secondary | ICD-10-CM | POA: Insufficient documentation

## 2018-08-16 DIAGNOSIS — Z7982 Long term (current) use of aspirin: Secondary | ICD-10-CM | POA: Insufficient documentation

## 2018-08-16 DIAGNOSIS — R519 Headache, unspecified: Secondary | ICD-10-CM

## 2018-08-16 LAB — I-STAT CHEM 8, ED
BUN: 44 mg/dL — ABNORMAL HIGH (ref 6–20)
Calcium, Ion: 1.19 mmol/L (ref 1.15–1.40)
Chloride: 111 mmol/L (ref 98–111)
Creatinine, Ser: 2.2 mg/dL — ABNORMAL HIGH (ref 0.44–1.00)
Glucose, Bld: 172 mg/dL — ABNORMAL HIGH (ref 70–99)
HCT: 35 % — ABNORMAL LOW (ref 36.0–46.0)
Hemoglobin: 11.9 g/dL — ABNORMAL LOW (ref 12.0–15.0)
Potassium: 4.6 mmol/L (ref 3.5–5.1)
Sodium: 141 mmol/L (ref 135–145)
TCO2: 23 mmol/L (ref 22–32)

## 2018-08-16 MED ORDER — KETOROLAC TROMETHAMINE 30 MG/ML IJ SOLN
30.0000 mg | Freq: Once | INTRAMUSCULAR | Status: DC
  Filled 2018-08-16: qty 1

## 2018-08-16 MED ORDER — METOCLOPRAMIDE HCL 5 MG/ML IJ SOLN
10.0000 mg | Freq: Once | INTRAMUSCULAR | Status: AC
  Administered 2018-08-16: 10 mg via INTRAMUSCULAR
  Filled 2018-08-16: qty 2

## 2018-08-16 MED ORDER — DIPHENHYDRAMINE HCL 50 MG/ML IJ SOLN
25.0000 mg | Freq: Once | INTRAMUSCULAR | Status: AC
  Administered 2018-08-16: 25 mg via INTRAMUSCULAR
  Filled 2018-08-16: qty 1

## 2018-08-16 MED ORDER — KETOROLAC TROMETHAMINE 30 MG/ML IJ SOLN: 30.0000 mg | Freq: Once | INTRAMUSCULAR | Status: DC

## 2018-08-16 NOTE — ED Notes (Signed)
Patient verbalizes understanding of discharge instructions. Opportunity for questioning and answers were provided. Armband removed by staff, pt discharged from ED.  

## 2018-08-16 NOTE — ED Triage Notes (Signed)
Pt reports headache since Monday, hx of stroke years. No neuro deficits noted, no other symptoms. Pt a.o, nad noted.

## 2018-08-16 NOTE — ED Provider Notes (Signed)
[facility]   CSN: [id number] Arrival date & time: 08/16/18  1925     History   Chief Complaint Chief Complaint  Patient presents with  . Headache    HPI Tonya Sanders is a 58 y.o. female.     Patient with history of previous stroke, diabetes, asthma, hypertension presenting with a left-sided posterior headache that has been progressively worsening for the past 5 days.  She reports headache in the back of her head without trauma.  Her husband was concerned because patient has had strokes in the past.  She states she was left with some dizziness from her strokes but she has no new neurological deficits.  No difficulty speaking or swallowing.  No focal weakness, numbness or tingling.  No vision changes.  No photophobia or phonophobia.  No chest pain or shortness of breath.  Denies any history of migraine headaches.  States his headache is gradual in onset and was not thunderclap.  There is no associated fever or chills or vomiting.  She is been taking Tylenol at home without relief.   Headache Associated symptoms: no abdominal pain, no congestion, no cough, no fever, no myalgias, no nausea, no photophobia and no vomiting     Past Medical History:  Diagnosis Date  . Anxiety   . Arthritis    "all over" (07/19/2016)  . Asthma   . Bipolar disorder (HCC)   . Chronic bronchitis (HCC)   . Chronic lower back pain   . Depression   . GERD (gastroesophageal reflux disease)   . H. pylori infection   . Hyperlipidemia   . Hypertension   . Migraine    "a few/month" (07/19/2016)  . Myocardial infarction (HCC) 2016  . Neuropathy   . Neuropathy    Hattie Perch/notes 07/19/2016  . PUD (peptic ulcer disease)    hx/notes 06/22/2010  . Seizures (HCC)   . Stroke [facility](HCC)    "I've had several"; denies residual (07/19/2016)  . Type II diabetes mellitus [facility](HCC)     Patient Active Problem List   Diagnosis Date Noted  . Lumbar compression fracture (HCC)  11/23/2017  . COPD (chronic obstructive pulmonary disease) (HCC) 11/23/2017  . Anemia 11/23/2017  . Rib fractures 11/22/2017  . Mixed hyperlipidemia due to type 2 diabetes mellitus (HCC) 06/10/2017  . Encounter for loop recorder check 06/10/2017  . Hypertension 07/25/2016  . Hyperlipidemia 07/25/2016  . Diabetes mellitus type 2 in obese (HCC) 07/25/2016  . Seizures (HCC) 07/25/2016  . Cerebellar stroke (HCC)   . 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 APPENDECTOMY    . CARDIAC CATHETERIZATION  2016  . CHOLECYSTECTOMY OPEN    . ESOPHAGOGASTRODUODENOSCOPY ENDOSCOPY  07/2005   Hattie Perch/notes 06/22/2010  . FLEXIBLE SIGMOIDOSCOPY  07/2005   Hattie Perch/notes 06/22/2010  . LOOP RECORDER INSERTION N/A 07/26/2016   Procedure: Loop Recorder Insertion;  Surgeon: Marinus Mawaylor, Gregg W, MD;  Location: [facility] INVASIVE CV LAB;  Service: Cardiovascular;  Laterality: N/A;  . TEE WITHOUT CARDIOVERSION N/A 07/26/2016   Procedure: TRANSESOPHAGEAL ECHOCARDIOGRAM (TEE);  Surgeon: Chrystie NoseHilty, Kenneth C, MD;  [facility];  Service: Cardiovascular;  Laterality: N/A;  . TUBAL LIGATION       OB History   No obstetric history on file.      Home Medications    Prior to Admission medications   Medication Sig Start Date End Date Taking? Authorizing Provider  aspirin 81 MG chewable tablet Chew  1 tablet (81 mg total) by mouth daily. 07/22/16   Charlsie Quest, MD  atorvastatin (LIPITOR) 40 MG tablet Take 1 tablet (40 mg total) by mouth daily at 6 PM. 07/21/16   Charlsie Quest, MD  clopidogrel (PLAVIX) 75 MG tablet Take 1 tablet (75 mg total) by mouth daily. 07/22/16   Charlsie Quest, MD  divalproex (DEPAKOTE) 500 MG DR tablet Take 500-1,000 mg by mouth See admin instructions. 1,000mg  in the morning and 500mg  in the evening 11/13/17   [provider]  DULoxetine (CYMBALTA) 60 MG capsule Take 1 capsule (60 mg total) by mouth 2 (two) times daily. 07/21/16   Charlsie Quest, MD   HYDROcodone-acetaminophen (NORCO/VICODIN) 5-325 MG tablet Take 1-2 tablets by mouth every 4 (four) hours as needed for moderate pain or severe pain. 11/26/17   Joseph Art, DO  hydrocortisone cream 1 % Apply to affected area 2 times daily 04/11/17   Graciella Freer A, PA-C  hydrOXYzine (ATARAX/VISTARIL) 25 MG tablet Take 0.5-1 tablets (12.5-25 mg total) by mouth every 8 (eight) hours as needed for itching. 06/12/17   Maczis, Elmer Sow, PA-C  insulin detemir (LEVEMIR) 100 UNIT/ML injection Inject 50 Units into the skin 2 (two) times daily.    [provider]  insulin lispro (HUMALOG KWIKPEN) 100 UNIT/ML KiwkPen Inject 0-50 Units into the skin as directed. Per sliding scale.    [provider]  Ipratropium-Albuterol (COMBIVENT RESPIMAT) 20-100 MCG/ACT AERS respimat Inhale 1 puff into the lungs 4 (four) times daily.  06/21/17   [provider]  lisinopril-hydrochlorothiazide (PRINZIDE,ZESTORETIC) 20-25 MG tablet Take 1 tablet by mouth daily. 06/13/17   [provider]  loperamide (IMODIUM) 2 MG capsule Take 2 capsules (4 mg total) by mouth at bedtime as needed for diarrhea or loose stools. 07/01/17   Derwood Kaplan, MD  Melatonin 5 MG TABS Take 1 tablet (5 mg total) by mouth at bedtime as needed (insomnia). 07/26/16   Molt, Bethany, DO  methocarbamol (ROBAXIN) 500 MG tablet Take 1 tablet (500 mg total) by mouth every 8 (eight) hours as needed for muscle spasms. 11/23/17   Joseph Art, DO  mupirocin cream (BACTROBAN) 2 % Apply 1 application topically 2 (two) times daily. 04/11/17   Maxwell Caul, PA-C  triamcinolone cream (KENALOG) 0.1 % Apply 1 application topically 2 (two) times daily. 06/12/17   Maczis, Elmer Sow, PA-C    Family History Family History  Problem Relation Age of Onset  . Heart disease Mother   . Heart attack Mother   . Heart failure Mother   . Hyperlipidemia Mother   . Hypertension Mother   . Cancer Mother   . COPD Mother   . Heart attack  Sister   . Heart disease Sister   . Heart failure Sister   . Hyperlipidemia Sister   . Hypertension Sister   . Cancer Sister   . Sleep apnea Sister     Social History Social History   Tobacco Use  . Smoking status: Former Games developer  . Smokeless tobacco: Never Used  . Tobacco comment: 07/19/2016 "just smoke when I get upset"  Substance Use Topics  . Alcohol use: Not Currently  . Drug use: No     Allergies   Doxycycline, Penicillins, Erythromycin, Nsaids, and Tomato   Review of Systems Review of Systems  Constitutional: Negative for activity change, appetite change and fever.  HENT: Negative for congestion and rhinorrhea.   Eyes: Negative for photophobia and visual disturbance.  Respiratory: Negative for cough, chest tightness and shortness of breath.   Cardiovascular: Negative for chest pain.  Gastrointestinal: Negative for abdominal pain, nausea and vomiting.  Genitourinary: Negative for dysuria, hematuria and vaginal discharge.  Musculoskeletal: Negative for arthralgias and myalgias.  Skin: Negative for rash.  Neurological: Positive for headaches. Negative for facial asymmetry.    all other systems are negative except as noted in the HPI and PMH.    Physical Exam Updated Vital Signs BP 111/76 (BP Location: Right Arm)   Pulse 81   Temp 98.2 F (36.8 C) (Oral)   Resp 18   SpO2 100%   Physical Exam Vitals signs and nursing note reviewed.  Constitutional:      General: She is not in acute distress.    Appearance: She is well-developed. She is obese.  HENT:     Head: Normocephalic and atraumatic.     Comments: No temporal artery tenderness    Mouth/Throat:     Pharynx: No oropharyngeal exudate.  Eyes:     Conjunctiva/sclera: Conjunctivae normal.     Pupils: Pupils are equal, round, and reactive to light.  Neck:     Musculoskeletal: Normal range of motion and neck supple.     Comments: No meningismus. Cardiovascular:     Rate and Rhythm: Normal rate and  regular rhythm.     Heart sounds: Normal heart sounds. No murmur.  Pulmonary:     Effort: Pulmonary effort is normal. No respiratory distress.     Breath sounds: Normal breath sounds.  Abdominal:     Palpations: Abdomen is soft.     Tenderness: There is no abdominal tenderness. There is no guarding or rebound.  Musculoskeletal: Normal range of motion.        General: No tenderness.  Skin:    General: Skin is warm.     Capillary Refill: Capillary refill takes less than 2 seconds.  Neurological:     General: No focal deficit present.     Mental Status: She is alert and oriented to person, place, and time. Mental status is at baseline.     Cranial Nerves: No cranial nerve deficit.     Motor: No abnormal muscle tone.     Coordination: Coordination normal.     Comments: CN 2-12 intact, no ataxia on finger to nose, no nystagmus, 5/5 strength throughout, no pronator drift, Romberg negative, normal gait.   Psychiatric:        Behavior: Behavior normal.      ED Treatments / Results  Labs (all labs ordered are listed, but only abnormal results are displayed) Labs Reviewed  I-STAT CHEM 8, ED - Abnormal; Notable for the following components:      Result Value   BUN 44 (*)    Creatinine, Ser 2.20 (*)    Glucose, Bld 172 (*)    Hemoglobin 11.9 (*)    HCT 35.0 (*)    All other components within normal limits    EKG None  Radiology Ct Head Wo Contrast  Result Date: 08/16/2018 CLINICAL DATA:  Headache for several days. History of stroke years ago. No neuro deficits today. EXAM: CT HEAD WITHOUT CONTRAST TECHNIQUE: Contiguous axial images were obtained from the base of the skull through the vertex without intravenous contrast. COMPARISON:  November 22, 2017 FINDINGS: Brain: No subdural, epidural, or subarachnoid hemorrhage. Chronic infarcts are seen in the right cerebellar hemisphere and the left occipital lobe. The ventricles and sulci are unchanged. No mass effect or midline shift. No  acute cortical ischemia is noted. The basal cisterns and brainstem are normal. Vascular: Calcified atherosclerosis is seen in the intracranial carotids. Skull: Normal. Negative for fracture or focal lesion. Sinuses/Orbits: No acute finding. Other: None. IMPRESSION: 1. Chronic infarcts in the right cerebellar hemisphere and the left occipital lobe. No acute abnormalities. Electronically Signed   By: Gerome Samavid  Williams III M.D   On: 08/16/2018 20:48    Procedures Procedures (including critical care time)  Medications Ordered in ED Medications  metoCLOPramide (REGLAN) injection 10 mg (has no administration in time range)  diphenhydrAMINE (BENADRYL) injection 25 mg (has no administration in time range)  ketorolac (TORADOL) 30 MG/ML injection 30 mg (has no administration in time range)     Initial Impression / Assessment and Plan / ED Course  I have reviewed the triage vital signs and the nursing notes.  Pertinent labs & imaging results that were available during my care of the patient were reviewed by me and considered in my medical decision making (see chart for details).       5 days of gradual onset posterior headache.  No thunderclap onset.  No new neurological deficits.  Neurological exam is nonfocal.  CT scan obtained in triage shows no acute pathology but does show chronic infarcts.  Low suspicion for subarachnoid hemorrhage, meningitis, temporal arteritis.  Creatinine 2.2 on screening labs.  Patient informed this is elevated from her baseline which was normal in January.  She will be given PO hydration  She is anxious to leave and does not want an IV.  Patient feels her headache is improved.  She is standing in the hallway and anxious to leave. Discussed her CT scan is reassuring.  Her creatinine is elevated and needs follow-up to recheck this after oral hydration at home.  Advised her to hold her lisinopril and follow-up with her primary doctor regarding this. Return precautions  discussed.  Final Clinical Impressions(s) / ED Diagnoses   Final diagnoses:  Bad headache    ED Discharge Orders    None       Jabori Henegar, Jeannett SeniorStephen, MD 08/17/18 0700

## 2018-08-16 NOTE — Discharge Instructions (Signed)
Your CT scan is stable.  Follow-up with your doctor.  Your kidney function today is elevated and this needs to be rechecked by your primary doctor.  You should increase your hydration at home and do not take your lisinopril until follow-up with your doctor regarding your kidney function.  Return to the ED with worsening headache, focal weakness, difficulty speaking or swallowing or other concerns.

## 2018-09-03 ENCOUNTER — Ambulatory Visit (INDEPENDENT_AMBULATORY_CARE_PROVIDER_SITE_OTHER): Payer: Medicare Other | Admitting: *Deleted

## 2018-09-03 DIAGNOSIS — I639 Cerebral infarction, unspecified: Secondary | ICD-10-CM | POA: Diagnosis not present

## 2018-09-03 LAB — CUP PACEART REMOTE DEVICE CHECK
Date Time Interrogation Session: 20200728153917
Implantable Pulse Generator Implant Date: 20180620

## 2018-09-16 NOTE — Progress Notes (Signed)
Carelink Summary Report / Loop Recorder 

## 2018-10-07 ENCOUNTER — Ambulatory Visit (INDEPENDENT_AMBULATORY_CARE_PROVIDER_SITE_OTHER): Payer: Medicare Other | Admitting: *Deleted

## 2018-10-07 DIAGNOSIS — I639 Cerebral infarction, unspecified: Secondary | ICD-10-CM

## 2018-10-07 LAB — CUP PACEART REMOTE DEVICE CHECK
Date Time Interrogation Session: 20200830164126
Implantable Pulse Generator Implant Date: 20180620

## 2018-10-15 NOTE — Progress Notes (Signed)
Carelink Summary Report / Loop Recorder 

## 2018-11-04 ENCOUNTER — Telehealth: Payer: Self-pay

## 2018-11-04 NOTE — Telephone Encounter (Signed)
I spoke with the pt and she agreed not to send manual transmissions unless we call and ask for them.

## 2018-11-08 ENCOUNTER — Ambulatory Visit (INDEPENDENT_AMBULATORY_CARE_PROVIDER_SITE_OTHER): Payer: Medicare Other | Admitting: *Deleted

## 2018-11-08 DIAGNOSIS — I639 Cerebral infarction, unspecified: Secondary | ICD-10-CM

## 2018-11-09 LAB — CUP PACEART REMOTE DEVICE CHECK
Date Time Interrogation Session: 20201002164112
Implantable Pulse Generator Implant Date: 20180620

## 2018-11-12 NOTE — Progress Notes (Signed)
Carelink Summary Report / Loop Recorder 

## 2018-12-11 ENCOUNTER — Ambulatory Visit (INDEPENDENT_AMBULATORY_CARE_PROVIDER_SITE_OTHER): Payer: Medicare Other | Admitting: *Deleted

## 2018-12-11 DIAGNOSIS — I639 Cerebral infarction, unspecified: Secondary | ICD-10-CM | POA: Diagnosis not present

## 2018-12-11 LAB — CUP PACEART REMOTE DEVICE CHECK
Date Time Interrogation Session: 20201104164211
Implantable Pulse Generator Implant Date: 20180620

## 2018-12-23 ENCOUNTER — Telehealth: Payer: Self-pay

## 2018-12-23 NOTE — Telephone Encounter (Signed)
Left message for patient regarding disconnected monitor.  

## 2018-12-29 NOTE — Progress Notes (Signed)
Carelink Summary Report / Loop Recorder 

## 2019-01-13 ENCOUNTER — Ambulatory Visit (INDEPENDENT_AMBULATORY_CARE_PROVIDER_SITE_OTHER): Payer: Medicare Other | Admitting: *Deleted

## 2019-01-13 DIAGNOSIS — I639 Cerebral infarction, unspecified: Secondary | ICD-10-CM | POA: Diagnosis not present

## 2019-01-14 LAB — CUP PACEART REMOTE DEVICE CHECK
Date Time Interrogation Session: 20201208110819
Implantable Pulse Generator Implant Date: 20180620

## 2019-02-14 ENCOUNTER — Ambulatory Visit (INDEPENDENT_AMBULATORY_CARE_PROVIDER_SITE_OTHER): Payer: Medicare Other | Admitting: *Deleted

## 2019-02-14 DIAGNOSIS — I639 Cerebral infarction, unspecified: Secondary | ICD-10-CM

## 2019-02-16 LAB — CUP PACEART REMOTE DEVICE CHECK
Date Time Interrogation Session: 20210110111010
Implantable Pulse Generator Implant Date: 20180620

## 2019-03-17 ENCOUNTER — Ambulatory Visit (INDEPENDENT_AMBULATORY_CARE_PROVIDER_SITE_OTHER): Payer: Medicare Other | Admitting: *Deleted

## 2019-03-17 DIAGNOSIS — I639 Cerebral infarction, unspecified: Secondary | ICD-10-CM | POA: Diagnosis not present

## 2019-03-17 LAB — CUP PACEART REMOTE DEVICE CHECK
Date Time Interrogation Session: 20210207233240
Implantable Pulse Generator Implant Date: 20180620

## 2019-03-17 NOTE — Progress Notes (Signed)
ILR Remote 

## 2019-04-04 ENCOUNTER — Telehealth: Payer: Self-pay

## 2019-04-04 NOTE — Telephone Encounter (Signed)
I spoke with the pt to let her know we do not need her to send manual transmissions.

## 2019-04-17 ENCOUNTER — Ambulatory Visit (INDEPENDENT_AMBULATORY_CARE_PROVIDER_SITE_OTHER): Payer: Medicare Other | Admitting: *Deleted

## 2019-04-17 DIAGNOSIS — I639 Cerebral infarction, unspecified: Secondary | ICD-10-CM | POA: Diagnosis not present

## 2019-04-17 LAB — CUP PACEART REMOTE DEVICE CHECK
Date Time Interrogation Session: 20210311001606
Implantable Pulse Generator Implant Date: 20180620

## 2019-04-17 NOTE — Progress Notes (Signed)
ILR Remote 

## 2019-05-19 ENCOUNTER — Ambulatory Visit (INDEPENDENT_AMBULATORY_CARE_PROVIDER_SITE_OTHER): Payer: Medicare Other | Admitting: *Deleted

## 2019-05-19 DIAGNOSIS — I639 Cerebral infarction, unspecified: Secondary | ICD-10-CM | POA: Diagnosis not present

## 2019-05-19 LAB — CUP PACEART REMOTE DEVICE CHECK
Date Time Interrogation Session: 20210411031300
Implantable Pulse Generator Implant Date: 20180620

## 2019-05-19 NOTE — Progress Notes (Signed)
ILR Remote 

## 2019-06-18 LAB — CUP PACEART REMOTE DEVICE CHECK
Date Time Interrogation Session: 20210512031659
Implantable Pulse Generator Implant Date: 20180620

## 2019-06-23 ENCOUNTER — Ambulatory Visit (INDEPENDENT_AMBULATORY_CARE_PROVIDER_SITE_OTHER): Payer: Medicare Other | Admitting: *Deleted

## 2019-06-23 DIAGNOSIS — I639 Cerebral infarction, unspecified: Secondary | ICD-10-CM

## 2019-06-23 NOTE — Progress Notes (Signed)
Carelink Summary Report / Loop Recorder 

## 2019-07-28 ENCOUNTER — Ambulatory Visit (INDEPENDENT_AMBULATORY_CARE_PROVIDER_SITE_OTHER): Payer: Medicare Other | Admitting: *Deleted

## 2019-07-28 DIAGNOSIS — I639 Cerebral infarction, unspecified: Secondary | ICD-10-CM | POA: Diagnosis not present

## 2019-07-28 LAB — CUP PACEART REMOTE DEVICE CHECK
Date Time Interrogation Session: 20210621005418
Implantable Pulse Generator Implant Date: 20180620

## 2019-07-28 NOTE — Progress Notes (Signed)
Carelink Summary Report / Loop Recorder 

## 2019-09-01 ENCOUNTER — Ambulatory Visit (INDEPENDENT_AMBULATORY_CARE_PROVIDER_SITE_OTHER): Payer: Medicare Other | Admitting: *Deleted

## 2019-09-01 DIAGNOSIS — I639 Cerebral infarction, unspecified: Secondary | ICD-10-CM

## 2019-09-01 LAB — CUP PACEART REMOTE DEVICE CHECK
Date Time Interrogation Session: 20210725231608
Implantable Pulse Generator Implant Date: 20180620

## 2019-09-02 NOTE — Progress Notes (Signed)
Carelink Summary Report / Loop Recorder 

## 2019-10-05 LAB — CUP PACEART REMOTE DEVICE CHECK
Date Time Interrogation Session: 20210827231830
Implantable Pulse Generator Implant Date: 20180620

## 2019-10-06 ENCOUNTER — Ambulatory Visit (INDEPENDENT_AMBULATORY_CARE_PROVIDER_SITE_OTHER): Payer: Medicare Other | Admitting: *Deleted

## 2019-10-06 DIAGNOSIS — I639 Cerebral infarction, unspecified: Secondary | ICD-10-CM

## 2019-10-07 NOTE — Progress Notes (Signed)
Carelink Summary Report / Loop Recorder 

## 2019-11-06 LAB — CUP PACEART REMOTE DEVICE CHECK
Date Time Interrogation Session: 20210929233214
Implantable Pulse Generator Implant Date: 20180620

## 2019-11-10 ENCOUNTER — Ambulatory Visit (INDEPENDENT_AMBULATORY_CARE_PROVIDER_SITE_OTHER): Payer: Medicare Other

## 2019-11-10 DIAGNOSIS — I639 Cerebral infarction, unspecified: Secondary | ICD-10-CM

## 2019-11-11 NOTE — Progress Notes (Signed)
Carelink Summary Report / Loop Recorder 

## 2019-12-10 LAB — CUP PACEART REMOTE DEVICE CHECK
Date Time Interrogation Session: 20211101233202
Implantable Pulse Generator Implant Date: 20180620

## 2019-12-15 ENCOUNTER — Ambulatory Visit (INDEPENDENT_AMBULATORY_CARE_PROVIDER_SITE_OTHER): Payer: Medicare Other

## 2019-12-15 DIAGNOSIS — I639 Cerebral infarction, unspecified: Secondary | ICD-10-CM | POA: Diagnosis not present

## 2019-12-15 NOTE — Progress Notes (Signed)
Carelink Summary Report / Loop Recorder 

## 2020-01-17 LAB — CUP PACEART REMOTE DEVICE CHECK
Date Time Interrogation Session: 20211204223505
Implantable Pulse Generator Implant Date: 20180620

## 2020-01-19 ENCOUNTER — Ambulatory Visit (INDEPENDENT_AMBULATORY_CARE_PROVIDER_SITE_OTHER): Payer: Medicare Other

## 2020-01-19 DIAGNOSIS — I639 Cerebral infarction, unspecified: Secondary | ICD-10-CM

## 2020-01-29 NOTE — Progress Notes (Signed)
Carelink Summary Report / Loop Recorder 

## 2020-02-23 ENCOUNTER — Ambulatory Visit (INDEPENDENT_AMBULATORY_CARE_PROVIDER_SITE_OTHER): Payer: Medicare Other

## 2020-02-23 DIAGNOSIS — I639 Cerebral infarction, unspecified: Secondary | ICD-10-CM

## 2020-02-23 LAB — CUP PACEART REMOTE DEVICE CHECK
Date Time Interrogation Session: 20220115233533
Implantable Pulse Generator Implant Date: 20180620

## 2020-03-06 NOTE — Progress Notes (Signed)
Carelink Summary Report / Loop Recorder 

## 2020-04-12 ENCOUNTER — Telehealth: Payer: Self-pay

## 2020-04-12 NOTE — Telephone Encounter (Signed)
Patient returning call. Informed patient that [facility] transmission revealed ILR at [facility] and would no longer transmit data. Discussed options of leaving in place vs explantation. Patient discussed with husband and has decided to decline removal at this time. She is instructed to contact device clinic in the event she changes her mind. All patient questions answered to her satisfaction. Home remotes cancelled, [employer] and [employer] updated. Return kit mailed.

## 2020-04-12 NOTE — Telephone Encounter (Signed)
Unsuccessful telephone encounter to Tonya Sanders to discuss [facility] alert received for ILR at [facility]. [employer] compliant VM message left requesting call back to [facility] device clinic at [phone].

## 2022-01-27 ENCOUNTER — Telehealth: Payer: Self-pay | Admitting: Cardiovascular Disease

## 2022-01-27 NOTE — Telephone Encounter (Signed)
Appt cancelled. Needs rescheduled on Dr. Royann Shivers DOD day as new patient.

## 2022-01-27 NOTE — Telephone Encounter (Signed)
Caller is reporting patient presented with asymptomatic bradycardia and second degree heart block type 1 which has since resolved.  Caller would like advice on releasing the patient with follow-up.

## 2022-01-27 NOTE — Telephone Encounter (Signed)
Spoke with ED. She is going home. Needs a non-urgent follow-up visit. Will be coming in as a new patient so can put on DOD new patient slot.

## 2022-01-27 NOTE — Telephone Encounter (Signed)
Tried to call pt's number received "your call cannot be completed as dialed "  Called husbands number and Left message to call back to check if the number that we have is correct. Scheduled appt 02-15-22.

## 2022-02-15 ENCOUNTER — Ambulatory Visit: Payer: Medicare Other | Attending: Cardiovascular Disease | Admitting: Cardiovascular Disease

## 2022-02-15 ENCOUNTER — Ambulatory Visit: Payer: Medicare Other | Admitting: Cardiovascular Disease

## 2022-03-22 ENCOUNTER — Encounter: Payer: Self-pay | Admitting: Cardiovascular Disease

## 2023-01-15 ENCOUNTER — Ambulatory Visit: Payer: Medicaid Other | Admitting: Nurse Practitioner
